# Patient Record
Sex: Female | Born: 1960 | Race: White | Hispanic: No | Marital: Married | Smoking: Former smoker
Health system: Southern US, Community
[De-identification: ages and names within clinical notes are randomized; demographics above are authoritative.]

## PROBLEM LIST (undated history)

## (undated) DIAGNOSIS — E669 Obesity, unspecified: Secondary | ICD-10-CM

## (undated) DIAGNOSIS — F411 Generalized anxiety disorder: Secondary | ICD-10-CM

## (undated) DIAGNOSIS — L0201 Cutaneous abscess of face: Secondary | ICD-10-CM

## (undated) DIAGNOSIS — F419 Anxiety disorder, unspecified: Secondary | ICD-10-CM

## (undated) DIAGNOSIS — R51 Headache: Secondary | ICD-10-CM

## (undated) DIAGNOSIS — F32A Depression, unspecified: Secondary | ICD-10-CM

## (undated) DIAGNOSIS — I10 Essential (primary) hypertension: Secondary | ICD-10-CM

## (undated) DIAGNOSIS — F329 Major depressive disorder, single episode, unspecified: Secondary | ICD-10-CM

## (undated) DIAGNOSIS — L03211 Cellulitis of face: Secondary | ICD-10-CM

## (undated) DIAGNOSIS — R946 Abnormal results of thyroid function studies: Secondary | ICD-10-CM

## (undated) HISTORY — DX: Generalized anxiety disorder: F41.1

## (undated) HISTORY — DX: Cutaneous abscess of face: L02.01

## (undated) HISTORY — DX: Cellulitis of face: L03.211

## (undated) HISTORY — DX: Abnormal results of thyroid function studies: R94.6

## (undated) HISTORY — DX: Anxiety disorder, unspecified: F41.9

## (undated) HISTORY — DX: Headache: R51

## (undated) HISTORY — DX: Essential (primary) hypertension: I10

## (undated) HISTORY — DX: Obesity, unspecified: E66.9

---

## 2000-07-05 ENCOUNTER — Encounter: Admission: RE | Admit: 2000-07-05 | Discharge: 2000-08-11 | Payer: Self-pay | Admitting: Obstetrics and Gynecology

## 2000-09-25 ENCOUNTER — Observation Stay (HOSPITAL_COMMUNITY): Admission: AD | Admit: 2000-09-25 | Discharge: 2000-09-26 | Payer: Self-pay | Admitting: Obstetrics & Gynecology

## 2000-09-25 ENCOUNTER — Encounter: Payer: Self-pay | Admitting: Obstetrics and Gynecology

## 2000-09-27 ENCOUNTER — Observation Stay (HOSPITAL_COMMUNITY): Admission: AD | Admit: 2000-09-27 | Discharge: 2000-09-28 | Payer: Self-pay | Admitting: Obstetrics and Gynecology

## 2000-10-01 ENCOUNTER — Inpatient Hospital Stay (HOSPITAL_COMMUNITY): Admission: AD | Admit: 2000-10-01 | Discharge: 2000-10-04 | Payer: Self-pay | Admitting: Obstetrics and Gynecology

## 2000-11-24 ENCOUNTER — Other Ambulatory Visit: Admission: RE | Admit: 2000-11-24 | Discharge: 2000-11-24 | Payer: Self-pay | Admitting: Obstetrics and Gynecology

## 2001-12-25 ENCOUNTER — Other Ambulatory Visit: Admission: RE | Admit: 2001-12-25 | Discharge: 2001-12-25 | Payer: Self-pay | Admitting: Obstetrics and Gynecology

## 2002-02-14 HISTORY — PX: LAPAROSCOPIC HYSTERECTOMY: SHX1926

## 2002-07-18 ENCOUNTER — Encounter: Admission: RE | Admit: 2002-07-18 | Discharge: 2002-10-16 | Payer: Self-pay | Admitting: Obstetrics and Gynecology

## 2002-12-26 ENCOUNTER — Observation Stay (HOSPITAL_COMMUNITY): Admission: RE | Admit: 2002-12-26 | Discharge: 2002-12-27 | Payer: Self-pay | Admitting: Obstetrics and Gynecology

## 2002-12-26 ENCOUNTER — Encounter (INDEPENDENT_AMBULATORY_CARE_PROVIDER_SITE_OTHER): Payer: Self-pay | Admitting: Specialist

## 2003-07-28 ENCOUNTER — Emergency Department (HOSPITAL_COMMUNITY): Admission: EM | Admit: 2003-07-28 | Discharge: 2003-07-28 | Payer: Self-pay | Admitting: Emergency Medicine

## 2004-02-15 HISTORY — PX: GALLBLADDER SURGERY: SHX652

## 2004-08-18 ENCOUNTER — Ambulatory Visit: Payer: Self-pay | Admitting: Pulmonary Disease

## 2004-09-17 ENCOUNTER — Encounter: Admission: RE | Admit: 2004-09-17 | Discharge: 2004-09-17 | Payer: Self-pay | Admitting: Family Medicine

## 2004-12-03 ENCOUNTER — Ambulatory Visit (HOSPITAL_COMMUNITY): Admission: RE | Admit: 2004-12-03 | Discharge: 2004-12-04 | Payer: Self-pay | Admitting: Surgery

## 2004-12-03 ENCOUNTER — Encounter (INDEPENDENT_AMBULATORY_CARE_PROVIDER_SITE_OTHER): Payer: Self-pay | Admitting: Specialist

## 2005-02-24 ENCOUNTER — Other Ambulatory Visit: Admission: RE | Admit: 2005-02-24 | Discharge: 2005-02-24 | Payer: Self-pay | Admitting: Obstetrics and Gynecology

## 2007-04-10 ENCOUNTER — Encounter: Admission: RE | Admit: 2007-04-10 | Discharge: 2007-04-10 | Payer: Self-pay | Admitting: Family Medicine

## 2007-11-29 ENCOUNTER — Encounter: Admission: RE | Admit: 2007-11-29 | Discharge: 2007-11-29 | Payer: Self-pay | Admitting: Obstetrics and Gynecology

## 2008-02-15 HISTORY — PX: ROTATOR CUFF REPAIR: SHX139

## 2008-12-12 ENCOUNTER — Ambulatory Visit (HOSPITAL_BASED_OUTPATIENT_CLINIC_OR_DEPARTMENT_OTHER): Admission: RE | Admit: 2008-12-12 | Discharge: 2008-12-12 | Payer: Self-pay | Admitting: Orthopedic Surgery

## 2009-01-01 ENCOUNTER — Emergency Department (HOSPITAL_COMMUNITY): Admission: EM | Admit: 2009-01-01 | Discharge: 2009-01-01 | Payer: Self-pay | Admitting: Emergency Medicine

## 2009-04-30 ENCOUNTER — Encounter: Admission: RE | Admit: 2009-04-30 | Discharge: 2009-04-30 | Payer: Self-pay | Admitting: Family Medicine

## 2010-03-07 ENCOUNTER — Encounter: Payer: Self-pay | Admitting: Family Medicine

## 2010-07-02 NOTE — Op Note (Signed)
[name], [name]                  ACCOUNT NO.:  [id number]   MEDICAL RECORD NO.:  [id number]          PATIENT TYPE:  AMB   LOCATION:  DAY                          FACILITY:  [facility]   PHYSICIAN:  Currie Paris, M.D.DATE OF BIRTH:  [birth year]   DATE OF PROCEDURE:  12/03/2004  DATE OF DISCHARGE:                                 OPERATIVE REPORT   CCS:  [id number].   PREOPERATIVE DIAGNOSIS:  Gallstones with biliary colic.   POSTOPERATIVE DIAGNOSIS:  Gallstones with biliary colic.   OPERATION:  Laparoscopic cholecystectomy with operative cholangiogram.   SURGEON:  Dr. Jamey Ripa.   ASSISTANT:  Dr. Orson Slick.   ANESTHESIA:  General endotracheal.   CLINICAL HISTORY:  This is a 50 year old lady with biliary type symptoms and  known gallstones.   DESCRIPTION OF PROCEDURE:  The patient was seen in the holding area and had  no further questions and confirmed that we were planning for  cholecystectomy.   She was taken to the operating room and after satisfactory general  anesthesia had been obtained, the abdomen was prepped and draped. The time-  out occurred.   I used 0.25% plain Marcaine for each incision. An umbilical incision was  made, the fascia opened and the peritoneum picked up and entered under  direct vision. A pursestring was placed, the Hasson introduced and the  abdomen insufflated to 15.   A camera was placed and no gross abnormalities were seen. The patient was  placed in reverse Trendelenburg and tilted to the left. A 10/11 trocar was  placed under direct vision in the epigastrium and likewise two 5 mm  laterally.   The gallbladder was retracted over the liver but was somewhat intrahepatic  and we could not really retract it completely over the liver. The peritoneum  over the triangle of Calot was opened and the peritoneum on either side of  the gallbladder opened up so the I could dissect out and nicely identified  the cystic artery and cystic duct and have a window  in the triangle of Calot  to be sure my anatomy was correct.   I put a single clip on the cystic duct at its junction with the gallbladder  and a single clip on the cystic artery. The cystic duct was opened and a  Cook catheter entered percutaneously and threaded into the cystic duct and  held with a clip.   Operative cholangiography showed good filling of the common duct and  duodenum and hepatic radicals with no filling defects.   The cystic duct catheter was removed and three clips placed on the stay side  of the cystic duct and it was divided. Two additional clips were placed on  the artery and it was divided leaving two clips on the stay side.   The gallbladder was removed from below to above. It was thin-walled and we  did spill some bile taking it out and it was relatively intrahepatic making  this a little bit tedious. Once it was disconnected, it was placed in a bag.  I irrigated and used the cautery to achieve  hemostasis. Once everything  appeared to be dry, the gallbladder was brought out the umbilical port. The  port was occluded for a few moments while we reinsufflated and made a final  check for hemostasis and a final irrigation. Again everything appeared to be  dry. The lateral ports were then removed under direct vision. The abdomen  was deflated through the epigastric port. The umbilical port was closed with  the pursestring prior to removing the camera. The skin was closed with 4-0  Monocryl subcuticular plus Dermabond.   The patient tolerated the procedure well. There were no operative  complications. All counts were correct.      Currie Paris, M.D.  Electronically Signed     CJS/MEDQ  D:  12/03/2004  T:  12/03/2004  Job:  [id number]   cc:   Talmadge Coventry, M.D.  Fax: [phone]

## 2010-07-02 NOTE — H&P (Signed)
NAME:  [name], [name]                            ACCOUNT NO.:  [id number]   MEDICAL RECORD NO.:  [id number]                   PATIENT TYPE:  OBV   LOCATION:  NA                                   FACILITY:  [facility]   PHYSICIAN:  Dineen Kid. Rana Snare, M.D.                 DATE OF BIRTH:  08-Feb-1961   DATE OF ADMISSION:  12/26/2002  DATE OF DISCHARGE:                                HISTORY & PHYSICAL   HISTORY OF PRESENT ILLNESS:  [name] is a 50 year old G2, P2 with  worsening problems with abnormal uterine bleeding, dysmenorrhea, and pelvic  pain which has not been controlled on birth control pills, cannot tolerate  birth control pills due to headaches the entire time she is on a pill which  is severe and disabling, when she does take the pill she gets some  controlled bleeding but again cannot tolerate the birth control pills.  Off  the pill she has irregular bleeding, terrible discomfort not responsive to  conservative management which has included anti-inflammatory medications,  she has had two D&Cs in the past and a sonohistogram as of last year which  was normal and has not helped with the irregular heavy bleeding.  She  desires definitive surgical treatment and requests hysterectomy.  She also  has a lot of discomfort with a predilection towards the left adnexa and  history of a left ovarian cyst and desires left oophorectomy at the time.   PAST MEDICAL HISTORY:  Her past medical history is significant for social  anxiety disorder.   PAST OBSTETRICAL HISTORY:  Her past OB history is significant for two  vaginal deliveries.   MEDICATIONS:  She is currently on Zoloft and ibuprofen.   ALLERGIES:  She is allergic to SULFA and VALIUM and IV DYE.   PHYSICAL EXAMINATION:  VITAL SIGNS:  Blood pressure is 124/78.  HEART:  Regular rate and rhythm.  LUNGS:  Clear to auscultation bilaterally.  ABDOMEN:  Nondistended, nontender.  PELVIC:  The uterus is anteverted, mobile, nontender to deep  palpation, the  left adnexa without adnexal masses.  Previous ultrasound shows a 7.3 x 4.4 x  5.7 cm uterus with no identifiable fibroids, the ovaries were within normal  limits.   IMPRESSION AND PLAN:  Menometrorrhagia, dysmenorrhea, pelvic pain not  responsive to conservative medical management including oral contraceptive  agents, anti-inflammatory medications, dilation and curettage's.  Patient  desires definitive surgical treatment.  Plan laparoscopic assisted vaginal  hysterectomy with left salpingo-oophorectomy.  Risks and benefits were  discussed at length which include but are  not limited to risk of infection,  bleeding, damage to bowel, bladder, ureters, other ovary, possibility this  may not alleviate the pelvic pain, worsened pelvic pain, risks associated  with blood transfusion and anesthesia.  She does give her informed consent.  She has been offered other treatment options such as Provera, repeat  dilation and curettage, or laparoscopic evaluation.  The  patient also reports that she has some mild urinary incontinence.  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  She has been evaluated by Dr. Etta Grandchild who at  this time does not feel surgical intervention is necessary so we will plan  to proceed as scheduled.                                               Dineen Kid Rana Snare, M.D.    DCL/MEDQ  D:  12/25/2002  T:  12/25/2002  Job:  [id number]

## 2010-07-02 NOTE — Op Note (Signed)
NAME:  [name], [name]                            ACCOUNT NO.:  [id number]   MEDICAL RECORD NO.:  [id number]                   PATIENT TYPE:  OBV   LOCATION:  9399                                 FACILITY:  [facility]   PHYSICIAN:  Dineen Kid. Rana Snare, M.D.                 DATE OF BIRTH:  1960/03/27   DATE OF PROCEDURE:  12/26/2002  DATE OF DISCHARGE:                                 OPERATIVE REPORT   PREOPERATIVE DIAGNOSES:  1. Menometrorrhagia.  2. Dysmenorrhea.  3. Pelvic pain not responsive to conservative medical management.   POSTOPERATIVE DIAGNOSES:  1. Menometrorrhagia.  2. Dysmenorrhea.  3. Pelvic pain not responsive to conservative medical management.  4. Pelvic adhesions.   PROCEDURE:  Laparoscopically assisted vaginal hysterectomy with left  salpingo-oophorectomy.   SURGEON:  Dineen Kid. Rana Snare, M.D.   ASSISTANT:  Raynald Kemp, M.D.   ANESTHESIA:  General endotracheal anesthesia.   INDICATIONS FOR PROCEDURE:  [name] is a 50 year old G2, P2 worsening  problems with uterine bleeding, dysmenorrhea and pelvic pain which had not  responded to conservative medical management which included birth control  pills, D&C or anti-inflammatory medications.  She presents for definitive  surgical treatments and plan left salpingo-oophorectomy and laparoscopically  assisted vaginal hysterectomy.  Risks and benefits were discussed at length.  Informed consent was obtained.  See history and physical for further  details.   FINDINGS:  Slightly enlarged uterus.  Left tube and ovary were completely  adhered to the pelvic sidewall, left descending colon was also adhered to  the left pelvic sidewall.   DESCRIPTION OF PROCEDURE:  After adequate analgesia, the patient placed in  the dorsal lithotomy position.  She is sterilely prepped and draped.  The  bladder was sterilely drained.  Speculum is placed.  Hulka tenaculum is  placed on the cervix.  A 1 cm infraumbilical skin incision was made.  A  Veress needle was inserted.  The abdomen was insufflated with dullness to  percussion.  An 11 mm trocar was inserted.  The above findings were noted.  A 5 mm trocar was inserted to the left of the midline under direct  visualization.  A Gyrus tripolar ligature was used to ligate across the  right fallopian tube down across the right utero-ovarian ligament and down  across the round ligament thoroughly cauterizing and ligating and dissecting  down below the round ligament with the right ovary and tube falling away  from the uterus and hemostasis achieved.  The left ovary and fallopian tube  were dissected from the pelvic sidewall with care taken to avoid the  underlying vessels.  The left descending colon was also dissected from the  pelvic sidewall.  The infundibulopelvic ligament was then identified and  ligated with the tripolar and dissected down across the round ligament.  This time, bleeding was noted underneath the uterus.  Reexamination of the  right  revealed the bleeding from the uterine vasculature from the right  side.  This was cauterized with the Gyrus.  Bladder was then elevated,  dissected off the anterior surface of the cervix.  The abdomen was  desufflated.  Legs were repositioned.  Weighted speculum was placed in the  vagina.  Jacob's tenaculum was placed in the posterior lip of the cervix.  Posterior colpotomy was performed.  At this time, some bleeding in the  posterior cul-de-sac was noted from the lower branch of the uterine artery.  LigaSure was used to ligate across the uterosacral ligaments and dissected.  Bladder pillars were then clamped with LigaSure clamp and dissected.  The  bladder was dissected off the anterior surface of the cervix.  Deaver  retractor placed underneath the bladder and successive bites across the  uterine vasculature with the LigaSure and good thermal burn noted.  Some  small perforated bleeders were noted along the uterosacral ligaments.   After  being identified, they were clamped and then ligated with the LigaSure with  hemostasis achieved.  The uterus was removed and the tube and ovary were  removed separately from the left side.  The uterosacral ligaments were  identified and ligated with figure-of-eight 0 Monocryl.  The posterior  peritoneum was then closed in a pursestring fashion.  The vaginal mucosa was  then plicated with figure-of-eights of 0 Monocryl in a vertical fashion with  good hemostasis achieved and good approximation achieved.  Foley catheter  was placed with return of clear, yellow urine. The abdomen was  reinsufflated.  Legs repositioned and small bleeders were noted and  cauterized with the bipolar cautery after copious irrigation and adequate  hemostasis.  Generalized oozing from the base of the bladder and peritoneal  edges previously cauterized were made hemostatic with Gelfoam placed in the  cul-de-sac.  Reexamination of the right ovary and tube revealed good  hemostasis and ureters appeared to be peristalsing without problem.  This  time, hemostasis appeared to be achieved.  The abdomen was desufflated.  The  trocars were removed.  The infraumbilical skin incision  was closed with 0  Vicryl figure-of-eight in the fascia and 3-0 Vicryl Rapide subcuticular  stitch.  The 5 mm site was closed with 3-0 Vicryl Rapide subcuticular  stitch.  The incisions were injected with 0.25% Marcaine 10 mL total.  The  patient tolerated the procedure well and was stable on transfer to the  recovery room.  Estimated blood loss was 800 mL.                                               Dineen Kid Rana Snare, M.D.    DCL/MEDQ  D:  12/26/2002  T:  12/26/2002  Job:  [id number]

## 2010-07-02 NOTE — Discharge Summary (Signed)
   NAME:  [name], [name]                            ACCOUNT NO.:  [id number]   MEDICAL RECORD NO.:  [id number]                   PATIENT TYPE:  OBV   LOCATION:  9103                                 FACILITY:  [facility]   PHYSICIAN:  Dineen Kid. Rana Snare, M.D.                 DATE OF BIRTH:  [birth year]   DATE OF ADMISSION:  12/26/2002  DATE OF DISCHARGE:                                 DISCHARGE SUMMARY   HISTORY OF PRESENT ILLNESS:  [name] is a 50 year old G2 P2, worsening  menometrorrhagia, dysmenorrhea, and pelvic pain which has not been under  control with conservative management including birth control pills,  antiinflammatory medications, or D&C.  She presents for definitive surgical  treatment with planned hysterectomy as well as left salpingo-oophorectomy  since the predominance of her pain is on the left side.   HOSPITAL COURSE:  The patient was admitted the same day of surgery.  She  underwent laparoscopy-assisted vaginal hysterectomy and left salpingo-  oophorectomy.  The surgery was complicated by slightly greater than average  intraoperative blood loss; however, no complications.  Her postoperative  course was unremarkable with good return of bowel function.  She ambulated  without difficulty, tolerated a regular diet.  Postoperative hemoglobin was  10.9.  She remained afebrile throughout her hospital stay and at the time of  discharge was tolerating a regular diet and pain was well controlled with  oral medications.  Physical exam at time of discharge:  The incisions were  clean, dry, and intact.  She had normal active bowel sounds and her abdomen  was nondistended and nontender.   DISPOSITION:  The patient discharged home with follow-up in the office in  one to two weeks.  Sent home with a routine order sheet for hysterectomy  including light activity; return for fever, pain, or bleeding.  She was sent  home with a prescription for Tylox #30.               Dineen Kid Rana Snare, M.D.    DCL/MEDQ  D:  12/27/2002  T:  12/27/2002  Job:  [id number]

## 2010-07-02 NOTE — Consult Note (Signed)
NAMEALICESON, DOLBOW                            ACCOUNT NO.:  [id number]   MEDICAL RECORD NO.:  [id number]                   PATIENT TYPE:  EMS   LOCATION:  ED                                   FACILITY:  [facility]   PHYSICIAN:  Katy Fitch. Naaman Plummer., M.D.          DATE OF BIRTH:  [birth year]   DATE OF CONSULTATION:  07/28/2003  DATE OF DISCHARGE:                                   CONSULTATION   CHIEF COMPLAINT:  Crushing injury of right ring finger.   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  Earlier  this evening, while visiting [employer], crushed her right ring  finger in a closing door mechanism.  She was transported to the [facility] emergency room, where she was evaluated by Dr. Carleene Cooper.  She was noted to be awake, alert and stable.  Her injuries were  confined to the right hand.  Dr. Ignacia Palma obtained x-rays revealing an open  fracture and requested a hand surgery consult.   CLINICAL EXAMINATION:  GENERAL:  An awake and alert 50 year old woman.  VITAL SIGNS:  Temperature 99.5, blood pressure 171/100, pulse 103,  respirations 20.   ALLERGIES:  IVP DYE, SULFA, VALIUM.   MEDICATIONS:  She was given a tetanus booster in the emergency room prior to  my consultation.  She was a current medication of Zoloft.   FAMILY HISTORY, SOCIAL HISTORY, REVIEW OF SYSTEMS:  These were reviewed and  are otherwise noncontributory.   DIAGNOSTIC DATA:  Her x-rays were reviewed and revealed a comminuted  fracture.  Her finger was inspected and revealed a stellate-type laceration  across her ventral nail fold, with a can lid-type near amputation of her  fingertip with marked crushing of the distal pulp.   ASSESSMENT:  1. Crushing open fracture of right ring finger distal phalanx.  2. Stellate explosion-type laceration of nail matrix.   PLAN:  We recommended  IV Ancef as a prophylactic antibiotic, followed by  digital block irrigation and debridement, and reconstruction of the  fingertip in the emergency room.  Questions regarding the procedure were  invited and answered.                                               Katy Fitch Naaman Plummer., M.D.    RVS/MEDQ  D:  07/28/2003  T:  07/29/2003  Job:  [id number]

## 2010-07-02 NOTE — Op Note (Signed)
[name]                            ACCOUNT NO.:  [id number]   MEDICAL RECORD NO.:  [id number]                   PATIENT TYPE:  EMS   LOCATION:  ED                                   FACILITY:  [facility]   PHYSICIAN:  [name]., M.D.          DATE OF BIRTH:  [birth year]   DATE OF PROCEDURE:  07/28/2003  DATE OF DISCHARGE:                                 OPERATIVE REPORT   PREOPERATIVE DIAGNOSES:  Complex crushing injury, right ring finger through  middle third of visible nail matrix; with open fracture of distal metaphysis  of distal phalanx and complex crush of pulp.   POSTOPERATIVE DIAGNOSES:  Complex crushing injury, right ring finger through  middle third of visible nail matrix; with open fracture of distal metaphysis  of distal phalanx and complex crush of pulp.   OPERATIONS:  1. Irrigation and debridement of complex open fracture of right ring finger     distal phalanx.  2. Open reduction internal fixation of distal phalanx fracture, utilizing 5-     0 nylon cutaneous sutures.  3. Reconstruction of complex nail fold laceration.   SURGEON:  [name], M.D.   ASSISTANT:  None.   ANESTHESIA:  Lidocaine 2% metacarpal head level block of right small finger,  with IV administration of Ancef as a perioperative antibiotic.   INDICATIONS:  [name] is a 50 year old right-hand dominant woman who  was entering the pool earlier this evening at [employer] with  her son, at which time she sustained a complex injury to her right ring  finger.   She accidentally caught her finger at a closing door mechanism, sustaining a  crush between the heavy door and the door frame.  She was transported by  private car to [facility] Emergency Room, where she was noted to have  significant bleeding and an obvious crush deformity of her right ring  finger.  X-rays were obtained by the attending emergency room physician, Dr.  Carleene Cooper, and interpreted  to reveal an open fracture of the distal  phalanx.  An upper extremity orthopedic consult was requested.   PHYSICAL EXAMINATION:  GENERAL:  Clinical examination revealed an awake and  alert 50 year old woman, who was otherwise unharmed except for injuries  confined to her right ring finger.  VITAL SIGNS:  Temperature 99.5, blood pressure 171/100 (automatic sitting  left arm), pulse 103, respirations 20.  EXTREMITIES:  Her clinical examination was unremarkable except for the right  hand, which was covered with quite a bit of blood from her open fracture.   DESCRIPTION OF PROCEDURE:  After informed consent, a 2% lidocaine metacarpal  head level block was placed with a sheath block of the flexor tendon sheath,  followed by a dorsal block across the metacarpal neck.  After five minutes  she had complete anesthesia.   Her arm was then prepped with a Hibiclens sponge  and irrigated with two  liters of sterile saline, thoroughly cleaning the right hand.  The hand was  then prepped with Betadine and sterilely draped with towels.  The finger was  exsanguinated with gauze wrap and a 1/4-inch Penrose drain was used over the  P1 segment as a digital tourniquet.   The fracture was opened shotgun-style and cleaned of all hematoma and  foreign material.  The fracture was then irrigated with Betadine and saline.  The fracture was then anatomically reduced and the near amputation of the  pulp corrected with six 5-0 nylon simple sutures.  The nail matrix was then  reassembled jigsaw puzzle style, with several fragments along the junction  with the proximal portion of the matrix being free fragments.  Multiple  sutures of 7-0 chromic were utilized to repair the nail matrix.   The nail plate was then cleaned of all soft tissue and replaced deep to the  dorsal nail fold.  The finger was dressed with Xero flow sterile gauze and a  one inch Coban finger dressing secured at the wrist level.   DISPOSITION:   There were no apparent complications.  [name] was given a  prescription for Keflex 500 mg one p.o. q.8h. x4 days for prophylactic  antibiotic, and received 1 g of Ancef perioperatively in the emergency room.  She will be given Percocet 5/325 mg 1-2 tablets p.o. q.4-6h. p.r.n. pain (a  total of 24 tablets without refill).  She is also given Motrin 600 mg one  p.o. q.6h. p.r.n. pain.   FOLLOW UP SPECIAL INSTRUCTIONS:  She will return to the office for follow-up  in approximately one week.  She is instructed to elevate her hand for the  next four days, and to work on range of motion exercises to the adjacent  uninjured fingers, wrist, elbow and shoulder.                                               Ann Fitch Naaman Montgomery., M.D.    RVS/MEDQ  D:  07/28/2003  T:  07/29/2003  Job:  [id number]

## 2011-01-18 ENCOUNTER — Other Ambulatory Visit (HOSPITAL_COMMUNITY): Payer: Self-pay | Admitting: Gastroenterology

## 2011-01-21 ENCOUNTER — Ambulatory Visit (HOSPITAL_COMMUNITY)
Admission: RE | Admit: 2011-01-21 | Discharge: 2011-01-21 | Disposition: A | Discharge status: Home / Self Care | Payer: Self-pay | Source: Ambulatory Visit | Attending: Gastroenterology | Admitting: Gastroenterology

## 2011-01-21 DIAGNOSIS — R1310 Dysphagia, unspecified: Secondary | ICD-10-CM | POA: Insufficient documentation

## 2011-12-29 ENCOUNTER — Other Ambulatory Visit (HOSPITAL_COMMUNITY): Payer: Self-pay | Admitting: Cardiovascular Disease

## 2011-12-29 ENCOUNTER — Other Ambulatory Visit (HOSPITAL_COMMUNITY): Payer: Self-pay | Admitting: *Deleted

## 2011-12-29 ENCOUNTER — Other Ambulatory Visit (HOSPITAL_COMMUNITY): Payer: Self-pay

## 2011-12-29 DIAGNOSIS — I10 Essential (primary) hypertension: Secondary | ICD-10-CM

## 2011-12-29 DIAGNOSIS — R0602 Shortness of breath: Secondary | ICD-10-CM

## 2012-01-05 ENCOUNTER — Encounter (HOSPITAL_COMMUNITY): Payer: Self-pay

## 2012-01-05 ENCOUNTER — Ambulatory Visit (HOSPITAL_COMMUNITY)
Admission: RE | Admit: 2012-01-05 | Discharge: 2012-01-05 | Disposition: A | Discharge status: Home / Self Care | Payer: Managed Care, Other (non HMO) | Source: Ambulatory Visit | Attending: Cardiovascular Disease | Admitting: Cardiovascular Disease

## 2012-01-05 DIAGNOSIS — I10 Essential (primary) hypertension: Secondary | ICD-10-CM | POA: Insufficient documentation

## 2012-01-05 DIAGNOSIS — E785 Hyperlipidemia, unspecified: Secondary | ICD-10-CM | POA: Insufficient documentation

## 2012-01-05 DIAGNOSIS — R0602 Shortness of breath: Secondary | ICD-10-CM | POA: Insufficient documentation

## 2012-01-05 NOTE — Progress Notes (Signed)
Renal Duplex Sonogram Completed. Sibyl Parr 01/05/2012

## 2012-01-05 NOTE — Progress Notes (Signed)
[facility]   2D echo completed 01/05/2012.   Cindy Tonnie Stillman, RDCS   

## 2012-01-09 ENCOUNTER — Ambulatory Visit (HOSPITAL_COMMUNITY)
Admission: RE | Admit: 2012-01-09 | Discharge: 2012-01-09 | Disposition: A | Discharge status: Home / Self Care | Payer: Managed Care, Other (non HMO) | Source: Ambulatory Visit | Attending: Cardiovascular Disease | Admitting: Cardiovascular Disease

## 2012-01-09 DIAGNOSIS — E785 Hyperlipidemia, unspecified: Secondary | ICD-10-CM | POA: Insufficient documentation

## 2012-01-09 DIAGNOSIS — R079 Chest pain, unspecified: Secondary | ICD-10-CM | POA: Insufficient documentation

## 2012-01-09 DIAGNOSIS — Z87891 Personal history of nicotine dependence: Secondary | ICD-10-CM | POA: Insufficient documentation

## 2012-01-09 DIAGNOSIS — K219 Gastro-esophageal reflux disease without esophagitis: Secondary | ICD-10-CM | POA: Insufficient documentation

## 2012-01-09 DIAGNOSIS — R0602 Shortness of breath: Secondary | ICD-10-CM | POA: Insufficient documentation

## 2012-01-09 MED ORDER — TECHNETIUM TC 99M SESTAMIBI GENERIC - CARDIOLITE
8.1000 | Freq: Once | INTRAVENOUS | Status: AC | PRN
  Administered 2012-01-09: 8 via INTRAVENOUS

## 2012-01-09 MED ORDER — TECHNETIUM TC 99M SESTAMIBI GENERIC - CARDIOLITE
26.4000 | Freq: Once | INTRAVENOUS | Status: AC | PRN
  Administered 2012-01-09: 26 via INTRAVENOUS

## 2012-01-09 MED ORDER — REGADENOSON 0.4 MG/5ML IV SOLN
0.4000 mg | Freq: Once | INTRAVENOUS | Status: AC
  Administered 2012-01-09: 0.4 mg via INTRAVENOUS

## 2012-01-09 NOTE — Procedures (Addendum)
[facility] [address] [place] [address] [place] [address] [phone]  Cardiology Nuclear Med Study  [name] is a 51 y.o. female     MRN : [id number]     DOB: [birth year]  Procedure Date: 01/09/2012  Nuclear Med Background Indication for Stress Test:  Evaluation for Ischemia History:  Gerd Cardiac Risk Factors: History of Smoking, Hypertension, Lipids and Overweight  Symptoms:  Chest Pain   Nuclear Pre-Procedure Caffeine/Decaff Intake:  5:00am NPO After: 12:00am  IV Site: R Antecubital  IV 0.9% NS with Angio Cath:  22g  Chest Size (in):  n/a IV Started by: Koren Shiver, CNMT  Height: 5\' 11"  (1.803 m)  Cup Size: D  BMI:  Body mass index is 30.40 kg/(m^2). Weight:  218 lb (98.884 kg)   Tech Comments:  n/a    Nuclear Med Study 1 or 2 day study: 1 day  Stress Test Type:  Lexiscan  Order Authorizing Provider:  R.Weintraub, MD   Resting Radionuclide: Technetium 15m Sestamibi  Resting Radionuclide Dose: 8.1 mCi   Stress Radionuclide:  Technetium 74m Sestamibi  Stress Radionuclide Dose: 26.4 mCi           Stress Protocol Rest HR:  71 Stress HR: 108  Rest BP: 158/108 Stress BP: 173/103  Exercise Time (min): n/a METS: n/a   Predicted Max HR: 171 bpm % Max HR: 63.16 bpm Rate Pressure Product: 21308   Dose of Adenosine (mg):  n/a Dose of Lexiscan: 0.4 mg  Dose of Atropine (mg): n/a Dose of Dobutamine: n/a mcg/kg/min (at max HR)  Stress Test Technologist: Esperanza Sheets, CCT Nuclear Technologist: Gonzella Lex, CNMT   Rest Procedure:  Myocardial perfusion imaging was performed at rest 45 minutes following the intravenous administration of Technetium 50m Sestamibi. Stress Procedure:  The patient received IV Lexiscan 0.4 mg over 15-seconds.  Technetium 71m Sestamibi injected at 30-seconds.  There were no significant changes with Lexiscan.  Quantitative spect images were obtained after a 45 minute delay.  Transient Ischemic  Dilatation (Normal <1.22):  0.91 Lung/Heart Ratio (Normal <0.45):  0.21 QGS EDV:  83 ml QGS ESV:  39 ml LV Ejection Fraction: 52%  Signed by Esperanza Sheets C on 01/09/2012 at 9:22 AM.    Rest ECG: NSR - Normal EKG  Stress ECG: No significant change from baseline ECG  QPS Raw Data Images:  Normal; no motion artifact; normal heart/lung ratio. Stress Images:  Normal homogeneous uptake in all areas of the myocardium. Rest Images:  Normal homogeneous uptake in all areas of the myocardium. Subtraction (SDS):  No evidence of ischemia. LV Wall Motion:  Borderline overall systolic function, estimated EF is 52%. Normal regional wall motion.  Impression Exercise Capacity:  Lexiscan with no exercise. BP Response:  Normal blood pressure response. Clinical Symptoms:  There is dyspnea. ECG Impression:  No significant ST segment change suggestive of ischemia. Comparison with Prior Nuclear Study: No images to compare  Overall Impression:  Normal stress nuclear study. There is no sign of scar or ischemia. There is normal motion and a low normal ejection fraction.     Thurmon Fair, MD  01/09/2012 12:32 PM

## 2012-01-30 ENCOUNTER — Other Ambulatory Visit: Payer: Self-pay | Admitting: Family Medicine

## 2012-01-30 DIAGNOSIS — N6452 Nipple discharge: Secondary | ICD-10-CM

## 2012-02-03 ENCOUNTER — Ambulatory Visit
Admission: RE | Admit: 2012-02-03 | Discharge: 2012-02-03 | Disposition: A | Discharge status: Home / Self Care | Payer: Managed Care, Other (non HMO) | Source: Ambulatory Visit | Attending: Family Medicine | Admitting: Family Medicine

## 2012-02-03 ENCOUNTER — Other Ambulatory Visit: Payer: Self-pay | Admitting: Family Medicine

## 2012-02-03 DIAGNOSIS — N6452 Nipple discharge: Secondary | ICD-10-CM

## 2012-06-07 ENCOUNTER — Encounter: Payer: Self-pay | Admitting: Cardiovascular Disease

## 2013-01-24 ENCOUNTER — Other Ambulatory Visit: Payer: Self-pay

## 2013-01-24 DIAGNOSIS — Z1231 Encounter for screening mammogram for malignant neoplasm of breast: Secondary | ICD-10-CM

## 2013-03-01 ENCOUNTER — Ambulatory Visit
Admission: RE | Admit: 2013-03-01 | Discharge: 2013-03-01 | Disposition: A | Discharge status: Home / Self Care | Payer: Managed Care, Other (non HMO) | Source: Ambulatory Visit

## 2013-03-01 DIAGNOSIS — Z1231 Encounter for screening mammogram for malignant neoplasm of breast: Secondary | ICD-10-CM

## 2013-05-09 ENCOUNTER — Other Ambulatory Visit: Payer: Self-pay | Admitting: Family Medicine

## 2013-05-09 DIAGNOSIS — R51 Headache: Secondary | ICD-10-CM

## 2013-05-14 ENCOUNTER — Ambulatory Visit
Admission: RE | Admit: 2013-05-14 | Discharge: 2013-05-14 | Disposition: A | Discharge status: Home / Self Care | Payer: Managed Care, Other (non HMO) | Source: Ambulatory Visit | Attending: Family Medicine | Admitting: Family Medicine

## 2013-05-14 DIAGNOSIS — R51 Headache: Secondary | ICD-10-CM

## 2013-05-28 ENCOUNTER — Encounter: Payer: Self-pay | Admitting: Neurology

## 2013-05-29 ENCOUNTER — Encounter: Payer: Self-pay | Admitting: Neurology

## 2013-05-29 ENCOUNTER — Encounter (INDEPENDENT_AMBULATORY_CARE_PROVIDER_SITE_OTHER): Payer: Self-pay

## 2013-05-29 ENCOUNTER — Ambulatory Visit (INDEPENDENT_AMBULATORY_CARE_PROVIDER_SITE_OTHER): Payer: Managed Care, Other (non HMO) | Admitting: Neurology

## 2013-05-29 VITALS — BP 161/101 | HR 59 | Temp 97.5°F | Ht 71.0 in | Wt 218.0 lb

## 2013-05-29 DIAGNOSIS — G44009 Cluster headache syndrome, unspecified, not intractable: Secondary | ICD-10-CM

## 2013-05-29 DIAGNOSIS — G43909 Migraine, unspecified, not intractable, without status migrainosus: Secondary | ICD-10-CM

## 2013-05-29 MED ORDER — VERAPAMIL HCL 40 MG PO TABS: 40.0000 mg | ORAL_TABLET | Freq: Every day | ORAL | Status: DC

## 2013-05-29 MED ORDER — RIZATRIPTAN BENZOATE 5 MG PO TBDP: 5.0000 mg | ORAL_TABLET | ORAL | Status: DC | PRN

## 2013-05-29 NOTE — Progress Notes (Signed)
Subjective:    Patient ID: [name] is a 53 y.o. female.  HPI    Huston Foley, MD, PhD [facility] [address] [address] [place], [place] [address]  Dear Dr. Hyacinth Meeker,  I saw your patient, [name], upon your kind request in my neurologic clinic today for initial consultation of her recurrent, daily headaches, as well as concern for underlying obstructive sleep apnea. The patient is unaccompanied today. As you know, [name] is a 53 year old right-handed woman with an underlying medical history of anxiety, depression, hypertension, migraines, subclinical thyroid disease, fatty liver on CT, and hyperlipidemia, who has had frequent headaches for the past 3 or 4 months. They are almost daily at this time and usually in the left frontal or bifrontal area and left posterior neck. Her headache is described as a dull ache but when she will often wake up with headaches. She reports snoring. She has been taking over-the-counter headache medication daily. She has had some associated nausea but no vomiting, some diarrhea. She tried Imitrex years ago for headaches which made her headaches worse. She is overweight. She is on medication for her anxiety and depression, as well as for blood pressure. She had an MRI brain without contrast on 05/14/2013: Minimal mucosal thickening right frontal sinus and left ethmoid sinus air cell. Otherwise essentially negative unenhanced MR of the brain. In addition, I personally reviewed the images through the PACS system. 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  She does not snore, but used to when she was about 10-15 lb heavier. She has not been told that she has pauses in her breathing while asleep and does not wake up gasping. She has noted that she may have several days of severe HAs and it may be associated with L eye tearing and she has noted eye  injection on the R. She has noted in January a bump over her L eyebrow which is somewhat tender to touch. She has no Hx of childhood migraines, no migraines in the family and no Hx of concussion, whiplash, head injury. She sleeps well, and denies EDS, her ESS is 3/24 and she does not nap.  She has been taking ibuprofen maybe 3 days a week for the past 3 months, but up to 8 pills a day.   Never had TIA or stroke symptoms, denying sudden onset of one sided weakness, numbness, tingling, slurring of speech or droopy face, hearing loss, tinnitus, diplopia or visual field cut or monocular loss of vision, but feels, that her L eyelid may become heavy.   Her Past Medical History Is Significant For: Past Medical History  Diagnosis Date  . Headache(784.0)   . Cellulitis and abscess of face   . Generalized anxiety disorder   . Unspecified essential hypertension   . Obesity, unspecified   . Nonspecific abnormal results of thyroid function study   . Anxiety     Her Past Surgical History Is Significant For: Past Surgical History  Procedure Laterality Date  . Rotator cuff repair  2010  . Gallbladder surgery  2006  . Laparoscopic hysterectomy  2004    Her Family History Is Significant For: Family History  Problem Relation Age of Onset  . Hypertension Father   . Cancer Paternal Grandmother   . Thyroid disease Brother   . Thyroid disease Brother   . Diabetes Maternal Grandfather   . Glaucoma Paternal Grandfather   .  Irritable bowel syndrome Father   . Tremor Father     Her Social History Is Significant For: History   Social History  . Marital Status: Married    Spouse Name: N/A    Number of Children: N/A  . Years of Education: N/A   Social History Main Topics  . Smoking status: Former Smoker    Quit date: 02/28/1992  . Smokeless tobacco: None  . Alcohol Use: No  . Drug Use: No  . Sexual Activity: None   Other Topics Concern  . None   Social History Narrative  . None    Her  Allergies Are:  Allergies  Allergen Reactions  . Sulfa Antibiotics Anaphylaxis  :   Her Current Medications Are:  Outpatient Encounter Prescriptions as of 05/29/2013  Medication Sig  . valsartan (DIOVAN) 80 MG tablet Take 80 mg by mouth daily.  Marland Kitchen. venlafaxine XR (EFFEXOR-XR) 75 MG 24 hr capsule Take 75 mg by mouth daily with breakfast.  . [DISCONTINUED] cephALEXin (KEFLEX) 500 MG capsule Take 500 mg by mouth 3 (three) times daily.  :  Review of Systems:  Out of a complete 14 point review of systems, all are reviewed and negative with the exception of these symptoms as listed below:  Review of Systems  Constitutional: Positive for fatigue and unexpected weight change (gain).  Eyes: Positive for pain and visual disturbance (blurred vision).  Respiratory: Negative.   Cardiovascular: Negative.   Gastrointestinal: Positive for diarrhea.  Endocrine: Positive for heat intolerance.       Flushing  Genitourinary: Positive for enuresis.  Musculoskeletal: Positive for arthralgias.  Skin: Negative.   Allergic/Immunologic: Negative.   Neurological: Positive for dizziness and headaches (chronic).  Hematological: Negative.   Psychiatric/Behavioral: The patient is nervous/anxious.     Objective:  Neurologic Exam  Physical Exam Physical Examination:   Filed Vitals:   05/29/13 0906  BP: 161/101  Pulse: 59  Temp: 97.5 F (36.4 C)   General Examination: The patient is a very pleasant 53 y.o. female in no acute distress. She appears well-developed and well-nourished and well groomed.   HEENT: Normocephalic, atraumatic, pupils are equal, round and reactive to light and accommodation. Funduscopic exam is normal with sharp disc margins noted. Extraocular tracking is good without limitation to gaze excursion or nystagmus noted. Normal smooth pursuit is noted. Hearing is grossly intact. Tympanic membranes are clear bilaterally. Face is symmetric with normal facial animation and normal facial  sensation. Speech is clear with no dysarthria noted. There is no hypophonia. There is no lip, neck/head, jaw or voice tremor. Neck is supple with full range of passive and active motion. There are no carotid bruits on auscultation. Oropharynx exam reveals: mild mouth dryness, adequate dental hygiene and no significant airway crowding. Mallampati is class I. Tongue protrudes centrally and palate elevates symmetrically. Tonsils are small. She has a slight swelling over the L eyebrow, tender to touch, no redness, no palpable mass, no temporal tender, no eye injection no ptosis, no excess tearing.   Chest: Clear to auscultation without wheezing, rhonchi or crackles noted.  Heart: S1+S2+0, regular and normal without murmurs, rubs or gallops noted.   Abdomen: Soft, non-tender and non-distended with normal bowel sounds appreciated on auscultation.  Extremities: There is no pitting edema in the distal lower extremities bilaterally. Pedal pulses are intact.  Skin: Warm and dry without trophic changes noted. There are no varicose veins.  Musculoskeletal: exam reveals no obvious joint deformities, tenderness or joint swelling or erythema.  Neurologically:  Mental status: The patient is awake, alert and oriented in all 4 spheres. Her immediate and remote memory, attention, language skills and fund of knowledge are appropriate. There is no evidence of aphasia, agnosia, apraxia or anomia. Speech is clear with normal prosody and enunciation. Thought process is linear. Mood is normal and affect is normal.  Cranial nerves II - XII are as described above under HEENT exam. In addition: shoulder shrug is normal with equal shoulder height noted. Motor exam: Normal bulk, strength and tone is noted. There is no drift, tremor or rebound. Romberg is negative. Reflexes are 2+ throughout. Babinski: Toes are flexor bilaterally. Fine motor skills and coordination: intact with normal finger taps, normal hand movements, normal  rapid alternating patting, normal foot taps and normal foot agility.  Cerebellar testing: No dysmetria or intention tremor on finger to nose testing. Heel to shin is unremarkable bilaterally. There is no truncal or gait ataxia.  Sensory exam: intact to light touch, pinprick, vibration, temperature sense and proprioception in the upper and lower extremities.  Gait, station and balance: She stands easily. No veering to one side is noted. No leaning to one side is noted. Posture is age-appropriate and stance is narrow based. Gait shows normal stride length and normal pace. No problems turning are noted. She turns en bloc. Tandem walk is unremarkable. Intact toe and heel stance is noted.               Assessment and Plan:   In summary, [name] is a very pleasant 53 y.o.-year old female with an underlying medical history of HTN and depression and anxiety, whose history and exam concerning for migraine variant, but also concerning for a cluster HA variant. Her physical exam is non-focal and I reassured the patient in that regard. In addition, she had a benign MRI brain, which I reviewed. Her sleep related history really does not suggest any sleep disordered breathing. She used to snore but no longer does and denies any apneas while asleep or gasping sensations while asleep or any significant sleep disruption or daytime somnolence. I had a long chat with the patient about my findings and the diagnosis of cluster HA and migraine, or migraine-cluster HA variants, the prognosis and treatment options. We talked about medical treatments and non-pharmacological approaches. We talked about maintaining a healthy lifestyle in general. I encouraged the patient to eat healthy, exercise daily and keep well hydrated, to keep a scheduled bedtime and wake time routine, to not skip any meals and eat healthy snacks in between meals and to have protein with every meal.   I advised the patient about common headache triggers:  sleep deprivation, dehydration, overheating, stress, hypoglycemia or skipping meals and blood sugar fluctuations, excessive pain medications or excessive alcohol use or caffeine withdrawal. Some people have food triggers such as aged cheese, orange juice or chocolate, especially dark chocolate, or MSG (monosodium glutamate). She is to try to avoid these headache triggers as much possible. It may be helpful to keep a headache diary to figure out what makes Her headaches worse or brings them on and what alleviates them. Some people report headache onset after exercise but studies have shown that regular exercise may actually prevent headaches from coming. If She has exercise-induced headaches, She is advised to drink plenty of fluid before and after exercising and that to not overdo it and to not overheat.  As far as further diagnostic testing is concerned, I suggested no new test today.  As far as medications are concerned, I recommended the following at this time: verapamil for prevention, starting low at 40 mg qHS and for abortive treatment, we will try Maxalt. I talked to her about side effects of verapamil and Maxalt and also talked to her and explained the very rare possibility of serotonin syndrome since she is also taking Effexor. Of note, when she tried Imitrex in the past she was also on an antidepressant. I answered all her questions today and the patient was in agreement with the above outlined plan. I would like to see the patient back in 3 months, sooner if the need arises and encouraged her to call with any interim questions, concerns, problems or updates and refill requests.   Thank you very much for allowing me to participate in the care of this nice patient. If I can be of any further assistance to you please do not hesitate to call me at [phone].  Sincerely,   Huston Foley, MD, PhD

## 2013-05-29 NOTE — Patient Instructions (Signed)
Please remember, common headache triggers are: sleep deprivation, dehydration, overheating, stress, hypoglycemia or skipping meals and blood sugar fluctuations, excessive pain medications or excessive alcohol use or caffeine withdrawal. Some people have food triggers such as aged cheese, orange juice or chocolate, especially dark chocolate, or MSG (monosodium glutamate). Try to avoid these headache triggers as much possible. It may be helpful to keep a headache diary to figure out what makes your headaches worse or brings them on and what alleviates them. Some people report headache onset after exercise but studies have shown that regular exercise may actually prevent headaches from coming. If you have exercise-induced headaches, please make sure that you drink plenty of fluid before and after exercising and that you do not over do it and do not overheat.  Please limit your ibuprofen use.   We will try verapamil low dose for daily headache prevention. Verapamil 40 mg: take 1 pill at bedtime. Side effects may include: constipation, lightheadedness, fainting, heartburn, wheezing, slow heartbeat, blurry vision, blue lips and fingernails, pins and needles sensations. If you have a rash or difficulty breathing or chest pain, you have to stop the medication immediately. Call 911 if you think you are having a serious reaction.   Maxalt orally disintegrating tab, 5 mg: take 1 pill early on when you suspect a migraine attack come on. You may take another pill within 2 hours, no more than 2 pills in 24 hours, no more than 3 times a week. Most people who take triptans do not have any serious side-effects. However, they can cause drowsiness (remember to not drive or use heavy machinery when drowsy), nausea, dizziness, dry mouth. Less common side effects include strange sensations, such as tightness in your chest or throat, tingling, flushing, and feelings of heaviness or pressure in areas such as the face, limbs, and  chest. These in the chest can mimic heart related pain (angina) and may cause alarm, but usually these sensations are not harmful or a sign of a heart attack. However, if you develop intense chest pain or sensations of discomfort, you should stop taking your medication and consult with me or your PCP or go to the nearest urgent care facility or ER or call 911.   Please be aware that Maxalt or any other triptan and Effexor SSRI or SNRI together can sometimes cause a rare and potentially dangerous adverse reaction, called SEROTONIN SYNDROME: Symptoms of this condition include (but are not limited to):   Agitation or restlessness, confusion, rapid heart rate and high blood pressure, dilated pupils, loss of muscle coordination or twitching muscles, muscle rigidity/stiffness, sweating and/or flushing, diarrhea, headache, shivering, goose bumps. If you have any of these symptoms you may have to stop the medication. Call your health care provider immediately.  Severe serotonin syndrome can be life-threatening emergency. Signs and symptoms of a severe reaction may include: high fever, seizures, irregular heartbeat, unconsciousness or altered level of awareness or personality changes.  If you have any of these new symptoms, call 911 or have someone take you to the emergency room.

## 2013-10-29 ENCOUNTER — Ambulatory Visit: Payer: Managed Care, Other (non HMO) | Admitting: Neurology

## 2014-01-17 ENCOUNTER — Other Ambulatory Visit: Payer: Self-pay | Admitting: Family Medicine

## 2014-01-17 DIAGNOSIS — R221 Localized swelling, mass and lump, neck: Secondary | ICD-10-CM

## 2014-01-20 ENCOUNTER — Encounter (INDEPENDENT_AMBULATORY_CARE_PROVIDER_SITE_OTHER): Payer: Self-pay

## 2014-01-20 ENCOUNTER — Ambulatory Visit
Admission: RE | Admit: 2014-01-20 | Discharge: 2014-01-20 | Disposition: A | Discharge status: Home / Self Care | Payer: Managed Care, Other (non HMO) | Source: Ambulatory Visit | Attending: Family Medicine | Admitting: Family Medicine

## 2014-01-20 DIAGNOSIS — R221 Localized swelling, mass and lump, neck: Secondary | ICD-10-CM

## 2014-01-20 MED ORDER — IOHEXOL 300 MG/ML  SOLN
75.0000 mL | Freq: Once | INTRAMUSCULAR | Status: AC | PRN
  Administered 2014-01-20: 75 mL via INTRAVENOUS

## 2014-04-28 ENCOUNTER — Other Ambulatory Visit: Payer: Self-pay

## 2014-04-28 DIAGNOSIS — Z1231 Encounter for screening mammogram for malignant neoplasm of breast: Secondary | ICD-10-CM

## 2014-05-09 ENCOUNTER — Ambulatory Visit
Admission: RE | Admit: 2014-05-09 | Discharge: 2014-05-09 | Disposition: A | Discharge status: Home / Self Care | Payer: Managed Care, Other (non HMO) | Source: Ambulatory Visit

## 2014-05-09 DIAGNOSIS — Z1231 Encounter for screening mammogram for malignant neoplasm of breast: Secondary | ICD-10-CM

## 2014-10-14 ENCOUNTER — Encounter (HOSPITAL_BASED_OUTPATIENT_CLINIC_OR_DEPARTMENT_OTHER): Payer: Self-pay | Admitting: *Deleted

## 2014-10-14 ENCOUNTER — Other Ambulatory Visit: Payer: Self-pay | Admitting: Orthopedic Surgery

## 2014-10-15 ENCOUNTER — Encounter (HOSPITAL_BASED_OUTPATIENT_CLINIC_OR_DEPARTMENT_OTHER)
Admission: RE | Disposition: A | Discharge status: Home / Self Care | Payer: Self-pay | Source: Ambulatory Visit | Attending: Orthopedic Surgery

## 2014-10-15 ENCOUNTER — Ambulatory Visit (HOSPITAL_BASED_OUTPATIENT_CLINIC_OR_DEPARTMENT_OTHER): Payer: Managed Care, Other (non HMO) | Admitting: Anesthesiology

## 2014-10-15 ENCOUNTER — Ambulatory Visit (HOSPITAL_BASED_OUTPATIENT_CLINIC_OR_DEPARTMENT_OTHER)
Admission: RE | Admit: 2014-10-15 | Discharge: 2014-10-15 | Disposition: A | Discharge status: Home / Self Care | Payer: Managed Care, Other (non HMO) | Source: Ambulatory Visit | Attending: Orthopedic Surgery | Admitting: Orthopedic Surgery

## 2014-10-15 ENCOUNTER — Encounter (HOSPITAL_BASED_OUTPATIENT_CLINIC_OR_DEPARTMENT_OTHER): Payer: Self-pay | Admitting: Certified Registered"

## 2014-10-15 DIAGNOSIS — Z87891 Personal history of nicotine dependence: Secondary | ICD-10-CM | POA: Insufficient documentation

## 2014-10-15 DIAGNOSIS — M23303 Other meniscus derangements, unspecified medial meniscus, right knee: Secondary | ICD-10-CM | POA: Diagnosis present

## 2014-10-15 DIAGNOSIS — I10 Essential (primary) hypertension: Secondary | ICD-10-CM | POA: Diagnosis not present

## 2014-10-15 DIAGNOSIS — F419 Anxiety disorder, unspecified: Secondary | ICD-10-CM | POA: Insufficient documentation

## 2014-10-15 HISTORY — DX: Major depressive disorder, single episode, unspecified: F32.9

## 2014-10-15 HISTORY — PX: KNEE ARTHROSCOPY WITH LATERAL MENISECTOMY: SHX6193

## 2014-10-15 HISTORY — DX: Depression, unspecified: F32.A

## 2014-10-15 SURGERY — ARTHROSCOPY, KNEE, WITH LATERAL MENISCECTOMY
Anesthesia: General | Site: Knee | Laterality: Right

## 2014-10-15 MED ORDER — MIDAZOLAM HCL 2 MG/2ML IJ SOLN: 1.0000 mg | INTRAMUSCULAR | Status: DC | PRN

## 2014-10-15 MED ORDER — ONDANSETRON HCL 4 MG/2ML IJ SOLN
INTRAMUSCULAR | Status: DC | PRN
  Administered 2014-10-15: 4 mg via INTRAVENOUS

## 2014-10-15 MED ORDER — FENTANYL CITRATE (PF) 100 MCG/2ML IJ SOLN
25.0000 ug | INTRAMUSCULAR | Status: DC | PRN
  Administered 2014-10-15: 50 ug via INTRAVENOUS
  Administered 2014-10-15: 25 ug via INTRAVENOUS
  Administered 2014-10-15: 50 ug via INTRAVENOUS

## 2014-10-15 MED ORDER — GLYCOPYRROLATE 0.2 MG/ML IJ SOLN: 0.2000 mg | Freq: Once | INTRAMUSCULAR | Status: DC | PRN

## 2014-10-15 MED ORDER — CEFAZOLIN SODIUM-DEXTROSE 2-3 GM-% IV SOLR
INTRAVENOUS | Status: AC
  Filled 2014-10-15: qty 50

## 2014-10-15 MED ORDER — OXYCODONE-ACETAMINOPHEN 5-325 MG PO TABS: 1.0000 | ORAL_TABLET | Freq: Four times a day (QID) | ORAL | Status: AC | PRN

## 2014-10-15 MED ORDER — FENTANYL CITRATE (PF) 100 MCG/2ML IJ SOLN
INTRAMUSCULAR | Status: AC
  Filled 2014-10-15: qty 2

## 2014-10-15 MED ORDER — LACTATED RINGERS IV SOLN
INTRAVENOUS | Status: DC | PRN
  Administered 2014-10-15 (×2): via INTRAVENOUS

## 2014-10-15 MED ORDER — FENTANYL CITRATE (PF) 100 MCG/2ML IJ SOLN
INTRAMUSCULAR | Status: AC
  Filled 2014-10-15: qty 4

## 2014-10-15 MED ORDER — MIDAZOLAM HCL 2 MG/2ML IJ SOLN
INTRAMUSCULAR | Status: AC
  Filled 2014-10-15: qty 4

## 2014-10-15 MED ORDER — PROPOFOL 10 MG/ML IV BOLUS
INTRAVENOUS | Status: DC | PRN
  Administered 2014-10-15: 250 mg via INTRAVENOUS

## 2014-10-15 MED ORDER — BUPIVACAINE HCL (PF) 0.5 % IJ SOLN
INTRAMUSCULAR | Status: DC | PRN
  Administered 2014-10-15: 20 mL

## 2014-10-15 MED ORDER — FENTANYL CITRATE (PF) 100 MCG/2ML IJ SOLN: 50.0000 ug | INTRAMUSCULAR | Status: DC | PRN

## 2014-10-15 MED ORDER — LACTATED RINGERS IV SOLN: INTRAVENOUS | Status: DC

## 2014-10-15 MED ORDER — DEXAMETHASONE SODIUM PHOSPHATE 4 MG/ML IJ SOLN
INTRAMUSCULAR | Status: DC | PRN
  Administered 2014-10-15: 10 mg via INTRAVENOUS

## 2014-10-15 MED ORDER — FENTANYL CITRATE (PF) 100 MCG/2ML IJ SOLN: INTRAMUSCULAR | Status: DC | PRN

## 2014-10-15 MED ORDER — PROMETHAZINE HCL 25 MG/ML IJ SOLN: 6.2500 mg | INTRAMUSCULAR | Status: DC | PRN

## 2014-10-15 MED ORDER — OXYCODONE HCL 5 MG PO TABS: 5.0000 mg | ORAL_TABLET | Freq: Once | ORAL | Status: DC | PRN

## 2014-10-15 MED ORDER — SODIUM CHLORIDE 0.9 % IR SOLN
Status: DC | PRN
Start: 2014-10-15 — End: 2014-10-15
  Administered 2014-10-15: 6000 mL

## 2014-10-15 MED ORDER — CEFAZOLIN SODIUM-DEXTROSE 2-3 GM-% IV SOLR
2.0000 g | INTRAVENOUS | Status: AC
  Administered 2014-10-15: 2 g via INTRAVENOUS

## 2014-10-15 MED ORDER — LIDOCAINE HCL (CARDIAC) 20 MG/ML IV SOLN
INTRAVENOUS | Status: AC
  Filled 2014-10-15: qty 5

## 2014-10-15 MED ORDER — EPINEPHRINE HCL 1 MG/ML IJ SOLN
INTRAMUSCULAR | Status: DC | PRN
  Administered 2014-10-15: 1 mg

## 2014-10-15 MED ORDER — FENTANYL CITRATE (PF) 100 MCG/2ML IJ SOLN
INTRAMUSCULAR | Status: DC | PRN
  Administered 2014-10-15 (×2): 50 ug via INTRAVENOUS

## 2014-10-15 MED ORDER — ONDANSETRON HCL 4 MG/2ML IJ SOLN
INTRAMUSCULAR | Status: AC
  Filled 2014-10-15: qty 2

## 2014-10-15 MED ORDER — CHLORHEXIDINE GLUCONATE 4 % EX LIQD: 60.0000 mL | Freq: Once | CUTANEOUS | Status: DC

## 2014-10-15 MED ORDER — MIDAZOLAM HCL 5 MG/5ML IJ SOLN
INTRAMUSCULAR | Status: DC | PRN
  Administered 2014-10-15: 2 mg via INTRAVENOUS

## 2014-10-15 MED ORDER — OXYCODONE HCL 5 MG/5ML PO SOLN: 5.0000 mg | Freq: Once | ORAL | Status: DC | PRN

## 2014-10-15 MED ORDER — SCOPOLAMINE 1 MG/3DAYS TD PT72: 1.0000 | MEDICATED_PATCH | Freq: Once | TRANSDERMAL | Status: DC | PRN

## 2014-10-15 SURGICAL SUPPLY — 40 items
BANDAGE ELASTIC 6 VELCRO ST LF (GAUZE/BANDAGES/DRESSINGS) ×4 IMPLANT
BLADE 4.2CUDA (BLADE) IMPLANT
BLADE GREAT WHITE 4.2 (BLADE) ×3 IMPLANT
BLADE GREAT WHITE 4.2MM (BLADE) ×1
CUTTER MENISCUS  4.2MM (BLADE)
CUTTER MENISCUS 4.2MM (BLADE) IMPLANT
DRAPE ARTHROSCOPY W/POUCH 114 (DRAPES) ×4 IMPLANT
DRSG EMULSION OIL 3X3 NADH (GAUZE/BANDAGES/DRESSINGS) ×4 IMPLANT
DURAPREP 26ML APPLICATOR (WOUND CARE) ×4 IMPLANT
ELECT MENISCUS 165MM 90D (ELECTRODE) IMPLANT
ELECT REM PT RETURN 9FT ADLT (ELECTROSURGICAL)
ELECTRODE REM PT RTRN 9FT ADLT (ELECTROSURGICAL) IMPLANT
GAUZE SPONGE 4X4 12PLY STRL (GAUZE/BANDAGES/DRESSINGS) ×4 IMPLANT
GLOVE BIOGEL PI IND STRL 7.0 (GLOVE) ×1 IMPLANT
GLOVE BIOGEL PI IND STRL 8 (GLOVE) ×4 IMPLANT
GLOVE BIOGEL PI INDICATOR 7.0 (GLOVE) ×2
GLOVE BIOGEL PI INDICATOR 8 (GLOVE) ×4
GLOVE ECLIPSE 6.5 STRL STRAW (GLOVE) ×3 IMPLANT
GLOVE ECLIPSE 7.5 STRL STRAW (GLOVE) ×8 IMPLANT
GOWN STRL REUS W/ TWL LRG LVL3 (GOWN DISPOSABLE) ×2 IMPLANT
GOWN STRL REUS W/ TWL XL LVL3 (GOWN DISPOSABLE) ×2 IMPLANT
GOWN STRL REUS W/TWL LRG LVL3 (GOWN DISPOSABLE) ×4
GOWN STRL REUS W/TWL XL LVL3 (GOWN DISPOSABLE) ×8 IMPLANT
HOLDER KNEE FOAM BLUE (MISCELLANEOUS) ×4 IMPLANT
KNEE WRAP E Z 3 GEL PACK (MISCELLANEOUS) ×4 IMPLANT
MANIFOLD NEPTUNE II (INSTRUMENTS) ×3 IMPLANT
NDL SAFETY ECLIPSE 18X1.5 (NEEDLE) ×1 IMPLANT
NEEDLE HYPO 18GX1.5 SHARP (NEEDLE) ×4
PACK ARTHROSCOPY DSU (CUSTOM PROCEDURE TRAY) ×4 IMPLANT
PACK BASIN DAY SURGERY FS (CUSTOM PROCEDURE TRAY) ×4 IMPLANT
PAD CAST 4YDX4 CTTN HI CHSV (CAST SUPPLIES) ×2 IMPLANT
PADDING CAST COTTON 4X4 STRL (CAST SUPPLIES) ×4
PENCIL BUTTON HOLSTER BLD 10FT (ELECTRODE) IMPLANT
SET ARTHROSCOPY TUBING (MISCELLANEOUS) ×4
SET ARTHROSCOPY TUBING LN (MISCELLANEOUS) ×2 IMPLANT
SUT ETHILON 4 0 PS 2 18 (SUTURE) IMPLANT
SYR 5ML LL (SYRINGE) ×4 IMPLANT
TOWEL OR 17X24 6PK STRL BLUE (TOWEL DISPOSABLE) ×4 IMPLANT
TOWEL OR NON WOVEN STRL DISP B (DISPOSABLE) ×1 IMPLANT
WATER STERILE IRR 1000ML POUR (IV SOLUTION) ×4 IMPLANT

## 2014-10-15 NOTE — Anesthesia Postprocedure Evaluation (Signed)
  Anesthesia Post-op Note  Patient: [name]  Procedure(s) Performed: Procedure(s) (LRB): RIGHT KNEE ARTHROSCOPY , medial plica, lateral menisectomy (Right)  Patient Location: PACU  Anesthesia Type: General  Level of Consciousness: awake and alert   Airway and Oxygen Therapy: Patient Spontanous Breathing  Post-op Pain: mild  Post-op Assessment: Post-op Vital signs reviewed, Patient's Cardiovascular Status Stable, Respiratory Function Stable, Patent Airway and No signs of Nausea or vomiting  Last Vitals:  Filed Vitals:   10/15/14 1100  BP: 123/68  Pulse: 77  Temp:   Resp: 18    Post-op Vital Signs: stable   Complications: No apparent anesthesia complications

## 2014-10-15 NOTE — Anesthesia Preprocedure Evaluation (Addendum)
Anesthesia Evaluation  Patient identified by MRN, date of birth, ID band Patient awake    Reviewed: Allergy & Precautions, H&P , NPO status , Patient's Chart, lab work & pertinent test results  History of Anesthesia Complications Negative for: history of anesthetic complications  Airway Mallampati: I  TM Distance: >3 FB Neck ROM: full    Dental no notable dental hx.    Pulmonary former smoker,  breath sounds clear to auscultation  Pulmonary exam normal       Cardiovascular hypertension, Pt. on medications Normal cardiovascular examRhythm:regular Rate:Normal     Neuro/Psych Anxiety    GI/Hepatic negative GI ROS, Neg liver ROS,   Endo/Other  negative endocrine ROS  Renal/GU negative Renal ROS     Musculoskeletal   Abdominal (+) - obese,   Peds  Hematology negative hematology ROS (+)   Anesthesia Other Findings 12/2011 normal stress echo  NPO appropriate, allergies reviewed Denies active cardiac or pulmonary symptoms, METS > 4 No recent congestive cough or symptoms of upper respiratory infection She has been working out, lost weight, BP better controlled, no further headaches   Reproductive/Obstetrics negative OB ROS                            Anesthesia Physical Anesthesia Plan  ASA: II  Anesthesia Plan: General   Post-op Pain Management:    Induction: Intravenous  Airway Management Planned: LMA  Additional Equipment:   Intra-op Plan:   Post-operative Plan: Extubation in OR  Informed Consent: I have reviewed the patients History and Physical, chart, labs and discussed the procedure including the risks, benefits and alternatives for the proposed anesthesia with the patient or authorized representative who has indicated his/her understanding and acceptance.     Plan Discussed with: Anesthesiologist, CRNA and Surgeon  Anesthesia Plan Comments:         Anesthesia  Quick Evaluation

## 2014-10-15 NOTE — Transfer of Care (Signed)
Immediate Anesthesia Transfer of Care Note  Patient: [name]  Procedure(s) Performed: Procedure(s): RIGHT KNEE ARTHROSCOPY , medial plica, lateral menisectomy (Right)  Patient Location: PACU  Anesthesia Type:General  Level of Consciousness: awake, alert  and oriented  Airway & Oxygen Therapy: Patient Spontanous Breathing and Patient connected to face mask oxygen  Post-op Assessment: Report given to RN, Post -op Vital signs reviewed and stable and Patient moving all extremities  Post vital signs: Reviewed and stable  Last Vitals:  Filed Vitals:   10/15/14 0843  BP: 129/82  Pulse: 97  Temp: 36.8 C  Resp: 16    Complications: No apparent anesthesia complications

## 2014-10-15 NOTE — Brief Op Note (Signed)
10/15/2014  10:24 AM  PATIENT:  [name]  54 y.o. female  PRE-OPERATIVE DIAGNOSIS:  probable medial meniscus tear right knee  POST-OPERATIVE DIAGNOSIS:  probable medial meniscus tear right knee  PROCEDURE:  Procedure(s): RIGHT KNEE ARTHROSCOPY , medial plica, lateral menisectomy (Right)  SURGEON:  Surgeon(s) and Role:    * Jodi Geralds, MD - Primary  PHYSICIAN ASSISTANT:   ASSISTANTS: bethune   ANESTHESIA:   general  EBL:  Total I/O In: 100 [I.V.:100] Out: -   BLOOD ADMINISTERED:none  DRAINS: none   LOCAL MEDICATIONS USED:  MARCAINE     SPECIMEN:  No Specimen  DISPOSITION OF SPECIMEN:  N/A  COUNTS:  YES  TOURNIQUET:  * No tourniquets in log *  DICTATION: .Other Dictation: Dictation Number [id number]  PLAN OF CARE: Discharge to home after PACU  PATIENT DISPOSITION:  PACU - hemodynamically stable.   Delay start of Pharmacological VTE agent (>24hrs) due to surgical blood loss or risk of bleeding: no

## 2014-10-15 NOTE — Anesthesia Procedure Notes (Signed)
Procedure Name: LMA Insertion Date/Time: 10/15/2014 9:52 AM Performed by: Curly Shores Pre-anesthesia Checklist: Patient identified, Emergency Drugs available, Suction available and Patient being monitored Patient Re-evaluated:Patient Re-evaluated prior to inductionOxygen Delivery Method: Circle System Utilized Preoxygenation: Pre-oxygenation with 100% oxygen Intubation Type: IV induction Ventilation: Mask ventilation without difficulty LMA: LMA inserted LMA Size: 4.0 Number of attempts: 1 Airway Equipment and Method: Bite block Placement Confirmation: positive ETCO2 and breath sounds checked- equal and bilateral Tube secured with: Tape Dental Injury: Teeth and Oropharynx as per pre-operative assessment

## 2014-10-15 NOTE — H&P (Signed)
PREOPERATIVE H&P  Chief Complaint: r knee pain  HPI: [name] is a 54 y.o. female who presents for evaluation of r knee pain. It has been present for greater than 6 months and has been worsening. She has failed conservative measures. Pain is rated as moderate.  Past Medical History  Diagnosis Date  . Headache(784.0)   . Cellulitis and abscess of face   . Generalized anxiety disorder   . Unspecified essential hypertension   . Obesity, unspecified   . Nonspecific abnormal results of thyroid function study   . Anxiety   . Depression    Past Surgical History  Procedure Laterality Date  . Rotator cuff repair  2010  . Gallbladder surgery  2006  . Laparoscopic hysterectomy  2004   Social History   Social History  . Marital Status: Married    Spouse Name: N/A  . Number of Children: N/A  . Years of Education: N/A   Social History Main Topics  . Smoking status: Former Smoker    Quit date: 02/28/1992  . Smokeless tobacco: None  . Alcohol Use: Yes  . Drug Use: No  . Sexual Activity: Yes   Other Topics Concern  . None   Social History Narrative   Family History  Problem Relation Age of Onset  . Hypertension Father   . Cancer Paternal Grandmother   . Thyroid disease Brother   . Thyroid disease Brother   . Diabetes Maternal Grandfather   . Glaucoma Paternal Grandfather   . Irritable bowel syndrome Father   . Tremor Father    Allergies  Allergen Reactions  . Sulfa Antibiotics Anaphylaxis   Prior to Admission medications   Medication Sig Start Date End Date Taking? Authorizing Provider  Probiotic Product (ULTRAFLORA IMMUNE HEALTH PO) Take by mouth.   Yes Historical Provider, MD  Rhubarb (ESTROVERA PO) Take by mouth.   Yes Historical Provider, MD  valsartan (DIOVAN) 80 MG tablet Take 80 mg by mouth daily.    Historical Provider, MD  venlafaxine XR (EFFEXOR-XR) 75 MG 24 hr capsule Take 75 mg by mouth daily with breakfast.    Historical Provider, MD     Positive  ROS: none  All other systems have been reviewed and were otherwise negative with the exception of those mentioned in the HPI and as above.  Physical Exam: There were no vitals filed for this visit.  General: Alert, no acute distress Cardiovascular: No pedal edema Respiratory: No cyanosis, no use of accessory musculature GI: No organomegaly, abdomen is soft and non-tender Skin: No lesions in the area of chief complaint Neurologic: Sensation intact distally Psychiatric: Patient is competent for consent with normal mood and affect Lymphatic: No axillary or cervical lymphadenopathy  MUSCULOSKELETAL: r knee: painful rom//no instability// med jt line tender trace effusion  Assessment/Plan: probable medial meniscus tear right knee Plan for Procedure(s): RIGHT KNEE ARTHROSCOPY   The risks benefits and alternatives were discussed with the patient including but not limited to the risks of nonoperative treatment, versus surgical intervention including infection, bleeding, nerve injury, malunion, nonunion, hardware prominence, hardware failure, need for hardware removal, blood clots, cardiopulmonary complications, morbidity, mortality, among others, and they were willing to proceed.  Predicted outcome is good, although there will be at least a six to nine month expected recovery.  Tamlyn Sides L, MD 10/15/2014 8:33 AM

## 2014-10-15 NOTE — Discharge Instructions (Signed)
POST-OP KNEE ARTHROSCOPY INSTRUCTIONS  °Dr. John Graves/Jim Bethune PA-C ° °Pain °You will be expected to have a moderate amount of pain in the affected knee for approximately two weeks. However, the first two days will be the most severe pain. A prescription has been provided to take as needed for the pain. The pain can be reduced by applying ice packs to the knee for the first 1-2 weeks post surgery. Also, keeping the leg elevated on pillows will help alleviate the pain. If you develop any acute pain or swelling in your calf muscle, please call the doctor. ° °Activity °It is preferred that you stay at bed rest for approximately 24 hours. However, you may go to the bathroom with help. Weight bearing as tolerated. You may begin the knee exercises the day of surgery. Discontinue crutches as the knee pain resolves. ° °Dressing °Keep the dressing dry. If the ace bandage should wrinkle or roll up, this can be rewrapped to prevent ridges in the bandage. You may remove all dressings in 48 hours,  apply bandaids to each wound. You may shower on the 4th day after surgery but no tub bath. ° °Symptoms to report to your doctor °Extreme pain °Extreme swelling °Temperature above 101 degrees °Change in the feeling, color, or movement of your toes °Redness, heat, or swelling at your incision ° °Exercise °If is preferred that as soon as possible you try to do a straight leg raise without bending the knee and concentrate on bringing the heel of your foot off the bed up to approximately 45 degrees and hold for the count of 10 seconds. Repeat this at least 10 times three or four times per day. Additional exercises are provided below. ° °You are encouraged to bend the knee as tolerated. ° °Follow-Up °Call to schedule a follow-up appointment in 5-7 days. Our office # is [phone]. ° °POST-OP EXERCISES ° °Short Arc Quads ° °1. Lie on back with legs straight. Place towel roll under thigh, just above knee. °2. Tighten thigh muscles to  straighten knee and lift heel off bed. °3. Hold for slow count of five, then lower. °4. Do three sets of ten ° ° ° °Straight Leg Raises ° °1. Lie on back with operative leg straight and other leg bent. °2. Keeping operative leg completely straight, slowly lift operative leg so foot is 5 inches off bed. °3. Hold for slow count of five, then lower. °4. Do three sets of ten. ° ° ° °DO BOTH EXERCISES 2 TIMES A DAY ° °Ankle Pumps ° °Work/move the operative ankle and foot up and down 10 times every hour while awake. ° ° °Post Anesthesia Home Care Instructions ° °Activity: °Get plenty of rest for the remainder of the day. A responsible adult should stay with you for 24 hours following the procedure.  °For the next 24 hours, DO NOT: °-Drive a car °-Operate machinery °-Drink alcoholic beverages °-Take any medication unless instructed by your physician °-Make any legal decisions or sign important papers. ° °Meals: °Start with liquid foods such as gelatin or soup. Progress to regular foods as tolerated. Avoid greasy, spicy, heavy foods. If nausea and/or vomiting occur, drink only clear liquids until the nausea and/or vomiting subsides. Call your physician if vomiting continues. ° °Special Instructions/Symptoms: °Your throat may feel dry or sore from the anesthesia or the breathing tube placed in your throat during surgery. If this causes discomfort, gargle with warm salt water. The discomfort should disappear within 24 hours. ° °If you   had a scopolamine patch placed behind your ear for the management of post- operative nausea and/or vomiting: ° °1. The medication in the patch is effective for 72 hours, after which it should be removed.  Wrap patch in a tissue and discard in the trash. Wash hands thoroughly with soap and water. °2. You may remove the patch earlier than 72 hours if you experience unpleasant side effects which may include dry mouth, dizziness or visual disturbances. °3. Avoid touching the patch. Wash your hands  with soap and water after contact with the patch. °  ° °

## 2014-10-16 ENCOUNTER — Encounter (HOSPITAL_BASED_OUTPATIENT_CLINIC_OR_DEPARTMENT_OTHER): Payer: Self-pay | Admitting: Orthopedic Surgery

## 2014-10-16 NOTE — Op Note (Signed)
[name], [name]                  ACCOUNT NO.:  [id number]  MEDICAL RECORD NO.:  [id number]  LOCATION:                               FACILITY:  [facility]  PHYSICIAN:  Harvie Junior, M.D.   DATE OF BIRTH:  April 12, 1960  DATE OF PROCEDURE:  10/15/2014 DATE OF DISCHARGE:  10/15/2014                              OPERATIVE REPORT   PREOPERATIVE DIAGNOSIS:  Medial meniscal tear.  POSTOPERATIVE DIAGNOSES: 1. 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 2. Lateral meniscal tear. 3. Medial shelf plica.  PROCEDURE: 1. Chondroplasty medial femoral condyle, down to bleeding bone were     necessary. 2. Chondroplasty patellofemoral trochlea, down to bleeding bone were     necessary. 3. Partial lateral meniscectomy. 4. Medial plica excision.  SURGEON:  Harvie Junior, M.D.  ASSISTANT:  Marshia Ly, PA  ANESTHESIA:  General.  BRIEF HISTORY:  [name] is a 54 year old female with a long history of significant complaints of right knee pain.  She has been treated conservatively for a prolonged period of time after failure of conservative care.  She was taken to the operating room for right knee arthroscopy.  DESCRIPTION OF PROCEDURE:  The patient was brought to the operating room.  After adequate anesthesia was obtained with general anesthetic, the patient was placed supine on the operating table.  The right leg was prepped and draped in usual sterile fashion.  Following this, routine arthroscopic examination of the knee revealed that there was significant chondromalacia of the medial femoral condyle, anteromedially and into the trochlea.  This was debrided back to a smooth and stable rim, where articular cartilage down to bleeding bone were necessary.  Attention turned to the medial compartment.  There was a large medial plica and this was debrided back to the rim of the wall.  This large medial plica was excised back to the wall, which gave access into the  medial compartment.  The medial meniscus was probed at length and felt to be normal.  The medial femoral condyle had some grade II, grade III, and some small areas of grade IV change.  This was debrided back to a smooth and stable rim, where articular cartilage down to bleeding bone were necessary.  Attention was then turned to the notch, where the ACL was normal.  Attention turned to the lateral compartment, where there were still some chondral pieces that were floating throughout the knee, which had been identified multiple times open throughout the knee.  The lateral meniscus had a fray on the outer horn and we started to probe back in that area.  It did appear that it was sort of an impending tear, we then did a debridement of the posterior horn back to a smooth and stable rim, put the shaver over there and were continued to remove small cartilaginous loose bodies from the knee joint.  Once we saw no more of these, we went back medially and milked the back of the knee.  They get rid of this as well.  Once these were all non-tension of patellofemoral joint, where we then did another irrigation for up to 6 L normal saline irrigation, just to  make sure that we got as many of these small fragments of articular cartilage out as we could.  At this point, the knee was copiously and thoroughly lavaged, suctioned dry.  The portals were closed with a bandage.  A 20 mL of 0.25% Marcaine was instilled in the knee for postoperative anesthesia.  The patient was taken to the recovery room in satisfactory condition.  Estimated blood loss for the procedure was minimal.     Harvie Junior, M.D.     Ranae Plumber  D:  10/15/2014  T:  10/15/2014  Job:  [id number]

## 2015-07-21 ENCOUNTER — Other Ambulatory Visit: Payer: Self-pay | Admitting: Family Medicine

## 2015-07-21 DIAGNOSIS — Z1231 Encounter for screening mammogram for malignant neoplasm of breast: Secondary | ICD-10-CM

## 2015-07-27 ENCOUNTER — Ambulatory Visit
Admission: RE | Admit: 2015-07-27 | Discharge: 2015-07-27 | Disposition: A | Discharge status: Home / Self Care | Payer: BLUE CROSS/BLUE SHIELD | Source: Ambulatory Visit | Attending: Family Medicine | Admitting: Family Medicine

## 2015-07-27 DIAGNOSIS — Z1231 Encounter for screening mammogram for malignant neoplasm of breast: Secondary | ICD-10-CM

## 2015-11-19 ENCOUNTER — Other Ambulatory Visit: Payer: Self-pay | Admitting: Nurse Practitioner

## 2015-11-19 DIAGNOSIS — R1030 Lower abdominal pain, unspecified: Secondary | ICD-10-CM

## 2015-11-25 ENCOUNTER — Ambulatory Visit
Admission: RE | Admit: 2015-11-25 | Discharge: 2015-11-25 | Disposition: A | Discharge status: Home / Self Care | Payer: BLUE CROSS/BLUE SHIELD | Source: Ambulatory Visit | Attending: Nurse Practitioner | Admitting: Nurse Practitioner

## 2015-11-25 DIAGNOSIS — R1030 Lower abdominal pain, unspecified: Secondary | ICD-10-CM

## 2015-11-25 MED ORDER — IOPAMIDOL (ISOVUE-300) INJECTION 61%
100.0000 mL | Freq: Once | INTRAVENOUS | Status: AC | PRN
  Administered 2015-11-25: 100 mL via INTRAVENOUS

## 2016-05-30 DIAGNOSIS — R1084 Generalized abdominal pain: Secondary | ICD-10-CM | POA: Diagnosis not present

## 2016-05-30 DIAGNOSIS — M545 Low back pain: Secondary | ICD-10-CM | POA: Diagnosis not present

## 2016-06-01 DIAGNOSIS — R1031 Right lower quadrant pain: Secondary | ICD-10-CM | POA: Diagnosis not present

## 2016-06-03 ENCOUNTER — Other Ambulatory Visit: Payer: Self-pay | Admitting: Family Medicine

## 2016-06-03 DIAGNOSIS — R1031 Right lower quadrant pain: Secondary | ICD-10-CM

## 2016-06-10 ENCOUNTER — Ambulatory Visit
Admission: RE | Admit: 2016-06-10 | Discharge: 2016-06-10 | Disposition: A | Discharge status: Home / Self Care | Payer: BLUE CROSS/BLUE SHIELD | Source: Ambulatory Visit | Attending: Family Medicine | Admitting: Family Medicine

## 2016-06-10 DIAGNOSIS — R1031 Right lower quadrant pain: Secondary | ICD-10-CM | POA: Diagnosis not present

## 2016-06-10 MED ORDER — IOPAMIDOL (ISOVUE-300) INJECTION 61%
125.0000 mL | Freq: Once | INTRAVENOUS | Status: AC | PRN
  Administered 2016-06-10: 125 mL via INTRAVENOUS

## 2016-07-04 DIAGNOSIS — R319 Hematuria, unspecified: Secondary | ICD-10-CM | POA: Diagnosis not present

## 2016-07-04 DIAGNOSIS — R102 Pelvic and perineal pain: Secondary | ICD-10-CM | POA: Diagnosis not present

## 2016-07-08 DIAGNOSIS — R198 Other specified symptoms and signs involving the digestive system and abdomen: Secondary | ICD-10-CM | POA: Diagnosis not present

## 2016-07-08 DIAGNOSIS — R1030 Lower abdominal pain, unspecified: Secondary | ICD-10-CM | POA: Diagnosis not present

## 2016-08-19 DIAGNOSIS — K582 Mixed irritable bowel syndrome: Secondary | ICD-10-CM | POA: Diagnosis not present

## 2016-08-19 DIAGNOSIS — R198 Other specified symptoms and signs involving the digestive system and abdomen: Secondary | ICD-10-CM | POA: Diagnosis not present

## 2016-08-19 DIAGNOSIS — R1030 Lower abdominal pain, unspecified: Secondary | ICD-10-CM | POA: Diagnosis not present

## 2016-10-11 ENCOUNTER — Other Ambulatory Visit: Payer: Self-pay | Admitting: Family Medicine

## 2016-10-11 DIAGNOSIS — Z1231 Encounter for screening mammogram for malignant neoplasm of breast: Secondary | ICD-10-CM

## 2016-10-24 ENCOUNTER — Ambulatory Visit
Admission: RE | Admit: 2016-10-24 | Discharge: 2016-10-24 | Disposition: A | Discharge status: Home / Self Care | Payer: BLUE CROSS/BLUE SHIELD | Source: Ambulatory Visit | Attending: Family Medicine | Admitting: Family Medicine

## 2016-10-24 DIAGNOSIS — Z1231 Encounter for screening mammogram for malignant neoplasm of breast: Secondary | ICD-10-CM | POA: Diagnosis not present

## 2017-01-31 DIAGNOSIS — R079 Chest pain, unspecified: Secondary | ICD-10-CM | POA: Diagnosis not present

## 2017-01-31 DIAGNOSIS — M546 Pain in thoracic spine: Secondary | ICD-10-CM | POA: Diagnosis not present

## 2017-03-13 DIAGNOSIS — Z86010 Personal history of colonic polyps: Secondary | ICD-10-CM | POA: Diagnosis not present

## 2017-03-13 DIAGNOSIS — K582 Mixed irritable bowel syndrome: Secondary | ICD-10-CM | POA: Diagnosis not present

## 2017-03-28 DIAGNOSIS — M25562 Pain in left knee: Secondary | ICD-10-CM | POA: Diagnosis not present

## 2017-03-28 DIAGNOSIS — M67911 Unspecified disorder of synovium and tendon, right shoulder: Secondary | ICD-10-CM | POA: Diagnosis not present

## 2017-05-01 DIAGNOSIS — I10 Essential (primary) hypertension: Secondary | ICD-10-CM | POA: Diagnosis not present

## 2017-05-01 DIAGNOSIS — N951 Menopausal and female climacteric states: Secondary | ICD-10-CM | POA: Diagnosis not present

## 2017-05-01 DIAGNOSIS — F411 Generalized anxiety disorder: Secondary | ICD-10-CM | POA: Diagnosis not present

## 2017-05-12 DIAGNOSIS — L298 Other pruritus: Secondary | ICD-10-CM | POA: Diagnosis not present

## 2017-05-18 DIAGNOSIS — M67911 Unspecified disorder of synovium and tendon, right shoulder: Secondary | ICD-10-CM | POA: Diagnosis not present

## 2017-05-30 ENCOUNTER — Other Ambulatory Visit: Payer: Self-pay

## 2017-05-30 DIAGNOSIS — D485 Neoplasm of uncertain behavior of skin: Secondary | ICD-10-CM | POA: Diagnosis not present

## 2017-07-04 DIAGNOSIS — E669 Obesity, unspecified: Secondary | ICD-10-CM | POA: Diagnosis not present

## 2017-07-04 DIAGNOSIS — I10 Essential (primary) hypertension: Secondary | ICD-10-CM | POA: Diagnosis not present

## 2017-07-04 DIAGNOSIS — Z1159 Encounter for screening for other viral diseases: Secondary | ICD-10-CM | POA: Diagnosis not present

## 2017-07-04 DIAGNOSIS — R7303 Prediabetes: Secondary | ICD-10-CM | POA: Diagnosis not present

## 2017-08-10 DIAGNOSIS — M67911 Unspecified disorder of synovium and tendon, right shoulder: Secondary | ICD-10-CM | POA: Diagnosis not present

## 2017-08-10 DIAGNOSIS — M542 Cervicalgia: Secondary | ICD-10-CM | POA: Diagnosis not present

## 2017-08-19 DIAGNOSIS — M25511 Pain in right shoulder: Secondary | ICD-10-CM | POA: Diagnosis not present

## 2017-08-24 DIAGNOSIS — M24811 Other specific joint derangements of right shoulder, not elsewhere classified: Secondary | ICD-10-CM | POA: Diagnosis not present

## 2017-08-24 DIAGNOSIS — M75121 Complete rotator cuff tear or rupture of right shoulder, not specified as traumatic: Secondary | ICD-10-CM | POA: Diagnosis not present

## 2017-10-04 DIAGNOSIS — M19011 Primary osteoarthritis, right shoulder: Secondary | ICD-10-CM | POA: Diagnosis not present

## 2017-10-04 DIAGNOSIS — Y929 Unspecified place or not applicable: Secondary | ICD-10-CM | POA: Diagnosis not present

## 2017-10-04 DIAGNOSIS — S4381XA Sprain of other specified parts of right shoulder girdle, initial encounter: Secondary | ICD-10-CM | POA: Diagnosis not present

## 2017-10-04 DIAGNOSIS — S46011A Strain of muscle(s) and tendon(s) of the rotator cuff of right shoulder, initial encounter: Secondary | ICD-10-CM | POA: Diagnosis not present

## 2017-10-04 DIAGNOSIS — M7521 Bicipital tendinitis, right shoulder: Secondary | ICD-10-CM | POA: Diagnosis not present

## 2017-10-04 DIAGNOSIS — Y999 Unspecified external cause status: Secondary | ICD-10-CM | POA: Diagnosis not present

## 2017-10-04 DIAGNOSIS — G8918 Other acute postprocedural pain: Secondary | ICD-10-CM | POA: Diagnosis not present

## 2017-10-04 DIAGNOSIS — M7541 Impingement syndrome of right shoulder: Secondary | ICD-10-CM | POA: Diagnosis not present

## 2017-10-04 DIAGNOSIS — S43491A Other sprain of right shoulder joint, initial encounter: Secondary | ICD-10-CM | POA: Diagnosis not present

## 2017-10-04 DIAGNOSIS — X58XXXA Exposure to other specified factors, initial encounter: Secondary | ICD-10-CM | POA: Diagnosis not present

## 2017-10-10 DIAGNOSIS — M75121 Complete rotator cuff tear or rupture of right shoulder, not specified as traumatic: Secondary | ICD-10-CM | POA: Diagnosis not present

## 2017-10-10 DIAGNOSIS — Z9889 Other specified postprocedural states: Secondary | ICD-10-CM | POA: Diagnosis not present

## 2017-10-12 DIAGNOSIS — M75121 Complete rotator cuff tear or rupture of right shoulder, not specified as traumatic: Secondary | ICD-10-CM | POA: Diagnosis not present

## 2017-10-17 DIAGNOSIS — M75121 Complete rotator cuff tear or rupture of right shoulder, not specified as traumatic: Secondary | ICD-10-CM | POA: Diagnosis not present

## 2017-10-19 DIAGNOSIS — M75121 Complete rotator cuff tear or rupture of right shoulder, not specified as traumatic: Secondary | ICD-10-CM | POA: Diagnosis not present

## 2017-10-23 DIAGNOSIS — M75121 Complete rotator cuff tear or rupture of right shoulder, not specified as traumatic: Secondary | ICD-10-CM | POA: Diagnosis not present

## 2017-10-26 DIAGNOSIS — M75121 Complete rotator cuff tear or rupture of right shoulder, not specified as traumatic: Secondary | ICD-10-CM | POA: Diagnosis not present

## 2017-10-30 DIAGNOSIS — L509 Urticaria, unspecified: Secondary | ICD-10-CM | POA: Diagnosis not present

## 2017-10-30 DIAGNOSIS — T7840XA Allergy, unspecified, initial encounter: Secondary | ICD-10-CM | POA: Diagnosis not present

## 2017-11-01 DIAGNOSIS — M75121 Complete rotator cuff tear or rupture of right shoulder, not specified as traumatic: Secondary | ICD-10-CM | POA: Diagnosis not present

## 2017-11-08 DIAGNOSIS — M75121 Complete rotator cuff tear or rupture of right shoulder, not specified as traumatic: Secondary | ICD-10-CM | POA: Diagnosis not present

## 2017-11-13 DIAGNOSIS — M75121 Complete rotator cuff tear or rupture of right shoulder, not specified as traumatic: Secondary | ICD-10-CM | POA: Diagnosis not present

## 2017-11-15 DIAGNOSIS — M75121 Complete rotator cuff tear or rupture of right shoulder, not specified as traumatic: Secondary | ICD-10-CM | POA: Diagnosis not present

## 2017-11-22 DIAGNOSIS — M75121 Complete rotator cuff tear or rupture of right shoulder, not specified as traumatic: Secondary | ICD-10-CM | POA: Diagnosis not present

## 2017-11-23 DIAGNOSIS — M75121 Complete rotator cuff tear or rupture of right shoulder, not specified as traumatic: Secondary | ICD-10-CM | POA: Diagnosis not present

## 2017-11-27 DIAGNOSIS — M75121 Complete rotator cuff tear or rupture of right shoulder, not specified as traumatic: Secondary | ICD-10-CM | POA: Diagnosis not present

## 2017-11-29 DIAGNOSIS — M75121 Complete rotator cuff tear or rupture of right shoulder, not specified as traumatic: Secondary | ICD-10-CM | POA: Diagnosis not present

## 2017-12-11 DIAGNOSIS — M75121 Complete rotator cuff tear or rupture of right shoulder, not specified as traumatic: Secondary | ICD-10-CM | POA: Diagnosis not present

## 2018-03-30 DIAGNOSIS — J0100 Acute maxillary sinusitis, unspecified: Secondary | ICD-10-CM | POA: Diagnosis not present

## 2018-03-30 DIAGNOSIS — J0110 Acute frontal sinusitis, unspecified: Secondary | ICD-10-CM | POA: Diagnosis not present

## 2018-03-30 DIAGNOSIS — J0120 Acute ethmoidal sinusitis, unspecified: Secondary | ICD-10-CM | POA: Diagnosis not present

## 2018-04-09 DIAGNOSIS — D225 Melanocytic nevi of trunk: Secondary | ICD-10-CM | POA: Diagnosis not present

## 2018-04-09 DIAGNOSIS — L814 Other melanin hyperpigmentation: Secondary | ICD-10-CM | POA: Diagnosis not present

## 2018-04-09 DIAGNOSIS — D2262 Melanocytic nevi of left upper limb, including shoulder: Secondary | ICD-10-CM | POA: Diagnosis not present

## 2018-04-09 DIAGNOSIS — D1801 Hemangioma of skin and subcutaneous tissue: Secondary | ICD-10-CM | POA: Diagnosis not present

## 2018-04-09 DIAGNOSIS — L820 Inflamed seborrheic keratosis: Secondary | ICD-10-CM | POA: Diagnosis not present

## 2018-04-24 DIAGNOSIS — M542 Cervicalgia: Secondary | ICD-10-CM | POA: Diagnosis not present

## 2018-06-28 DIAGNOSIS — M25561 Pain in right knee: Secondary | ICD-10-CM | POA: Diagnosis not present

## 2018-08-02 DIAGNOSIS — E663 Overweight: Secondary | ICD-10-CM | POA: Diagnosis not present

## 2018-08-02 DIAGNOSIS — I10 Essential (primary) hypertension: Secondary | ICD-10-CM | POA: Diagnosis not present

## 2018-08-02 DIAGNOSIS — F411 Generalized anxiety disorder: Secondary | ICD-10-CM | POA: Diagnosis not present

## 2018-08-02 DIAGNOSIS — R7303 Prediabetes: Secondary | ICD-10-CM | POA: Diagnosis not present

## 2018-09-21 DIAGNOSIS — G8929 Other chronic pain: Secondary | ICD-10-CM | POA: Diagnosis not present

## 2018-09-21 DIAGNOSIS — R5383 Other fatigue: Secondary | ICD-10-CM | POA: Diagnosis not present

## 2018-09-21 DIAGNOSIS — M546 Pain in thoracic spine: Secondary | ICD-10-CM | POA: Diagnosis not present

## 2018-09-21 DIAGNOSIS — G44219 Episodic tension-type headache, not intractable: Secondary | ICD-10-CM | POA: Diagnosis not present

## 2018-10-18 DIAGNOSIS — R7303 Prediabetes: Secondary | ICD-10-CM | POA: Diagnosis not present

## 2018-10-18 DIAGNOSIS — R5383 Other fatigue: Secondary | ICD-10-CM | POA: Diagnosis not present

## 2018-10-18 DIAGNOSIS — I10 Essential (primary) hypertension: Secondary | ICD-10-CM | POA: Diagnosis not present

## 2018-10-24 DIAGNOSIS — I10 Essential (primary) hypertension: Secondary | ICD-10-CM | POA: Diagnosis not present

## 2018-10-24 DIAGNOSIS — E7800 Pure hypercholesterolemia, unspecified: Secondary | ICD-10-CM | POA: Diagnosis not present

## 2018-10-24 DIAGNOSIS — E669 Obesity, unspecified: Secondary | ICD-10-CM | POA: Diagnosis not present

## 2018-10-24 DIAGNOSIS — R7303 Prediabetes: Secondary | ICD-10-CM | POA: Diagnosis not present

## 2018-11-07 ENCOUNTER — Other Ambulatory Visit: Payer: Self-pay | Admitting: Family Medicine

## 2018-11-07 DIAGNOSIS — Z1231 Encounter for screening mammogram for malignant neoplasm of breast: Secondary | ICD-10-CM

## 2018-11-12 ENCOUNTER — Ambulatory Visit
Admission: RE | Admit: 2018-11-12 | Discharge: 2018-11-12 | Disposition: A | Discharge status: Home / Self Care | Payer: BLUE CROSS/BLUE SHIELD | Source: Ambulatory Visit | Attending: Family Medicine | Admitting: Family Medicine

## 2018-11-12 ENCOUNTER — Other Ambulatory Visit: Payer: Self-pay

## 2018-11-12 DIAGNOSIS — Z1231 Encounter for screening mammogram for malignant neoplasm of breast: Secondary | ICD-10-CM | POA: Diagnosis not present

## 2018-12-18 ENCOUNTER — Ambulatory Visit: Payer: BC Managed Care – PPO | Admitting: Registered"

## 2018-12-18 DIAGNOSIS — N951 Menopausal and female climacteric states: Secondary | ICD-10-CM | POA: Diagnosis not present

## 2018-12-20 DIAGNOSIS — N951 Menopausal and female climacteric states: Secondary | ICD-10-CM | POA: Diagnosis not present

## 2018-12-20 DIAGNOSIS — Z72820 Sleep deprivation: Secondary | ICD-10-CM | POA: Diagnosis not present

## 2018-12-20 DIAGNOSIS — M2550 Pain in unspecified joint: Secondary | ICD-10-CM | POA: Diagnosis not present

## 2018-12-20 DIAGNOSIS — R232 Flushing: Secondary | ICD-10-CM | POA: Diagnosis not present

## 2019-01-17 DIAGNOSIS — N951 Menopausal and female climacteric states: Secondary | ICD-10-CM | POA: Diagnosis not present

## 2019-01-17 DIAGNOSIS — R232 Flushing: Secondary | ICD-10-CM | POA: Diagnosis not present

## 2019-01-17 DIAGNOSIS — Z72820 Sleep deprivation: Secondary | ICD-10-CM | POA: Diagnosis not present

## 2019-01-17 DIAGNOSIS — M2550 Pain in unspecified joint: Secondary | ICD-10-CM | POA: Diagnosis not present

## 2019-01-22 DIAGNOSIS — R071 Chest pain on breathing: Secondary | ICD-10-CM | POA: Diagnosis not present

## 2019-01-22 DIAGNOSIS — R05 Cough: Secondary | ICD-10-CM | POA: Diagnosis not present

## 2019-01-23 DIAGNOSIS — R7303 Prediabetes: Secondary | ICD-10-CM | POA: Diagnosis not present

## 2019-01-23 DIAGNOSIS — R071 Chest pain on breathing: Secondary | ICD-10-CM | POA: Diagnosis not present

## 2019-01-23 DIAGNOSIS — E7800 Pure hypercholesterolemia, unspecified: Secondary | ICD-10-CM | POA: Diagnosis not present

## 2019-01-25 DIAGNOSIS — N951 Menopausal and female climacteric states: Secondary | ICD-10-CM | POA: Diagnosis not present

## 2019-01-25 DIAGNOSIS — M2550 Pain in unspecified joint: Secondary | ICD-10-CM | POA: Diagnosis not present

## 2019-01-25 DIAGNOSIS — R5382 Chronic fatigue, unspecified: Secondary | ICD-10-CM | POA: Diagnosis not present

## 2019-04-02 DIAGNOSIS — L570 Actinic keratosis: Secondary | ICD-10-CM | POA: Diagnosis not present

## 2019-04-11 DIAGNOSIS — M2550 Pain in unspecified joint: Secondary | ICD-10-CM | POA: Diagnosis not present

## 2019-04-11 DIAGNOSIS — N951 Menopausal and female climacteric states: Secondary | ICD-10-CM | POA: Diagnosis not present

## 2019-04-11 DIAGNOSIS — R5382 Chronic fatigue, unspecified: Secondary | ICD-10-CM | POA: Diagnosis not present

## 2019-04-11 DIAGNOSIS — E039 Hypothyroidism, unspecified: Secondary | ICD-10-CM | POA: Diagnosis not present

## 2019-04-17 DIAGNOSIS — Z72820 Sleep deprivation: Secondary | ICD-10-CM | POA: Diagnosis not present

## 2019-04-17 DIAGNOSIS — N951 Menopausal and female climacteric states: Secondary | ICD-10-CM | POA: Diagnosis not present

## 2019-04-17 DIAGNOSIS — R232 Flushing: Secondary | ICD-10-CM | POA: Diagnosis not present

## 2019-04-17 DIAGNOSIS — M2550 Pain in unspecified joint: Secondary | ICD-10-CM | POA: Diagnosis not present

## 2019-06-10 DIAGNOSIS — R232 Flushing: Secondary | ICD-10-CM | POA: Diagnosis not present

## 2019-06-10 DIAGNOSIS — N951 Menopausal and female climacteric states: Secondary | ICD-10-CM | POA: Diagnosis not present

## 2019-06-27 DIAGNOSIS — H04301 Unspecified dacryocystitis of right lacrimal passage: Secondary | ICD-10-CM | POA: Diagnosis not present

## 2019-07-09 DIAGNOSIS — E663 Overweight: Secondary | ICD-10-CM | POA: Diagnosis not present

## 2019-07-09 DIAGNOSIS — E7800 Pure hypercholesterolemia, unspecified: Secondary | ICD-10-CM | POA: Diagnosis not present

## 2019-07-09 DIAGNOSIS — F411 Generalized anxiety disorder: Secondary | ICD-10-CM | POA: Diagnosis not present

## 2019-07-09 DIAGNOSIS — I10 Essential (primary) hypertension: Secondary | ICD-10-CM | POA: Diagnosis not present

## 2019-08-30 DIAGNOSIS — R0981 Nasal congestion: Secondary | ICD-10-CM | POA: Diagnosis not present

## 2019-09-19 DIAGNOSIS — I10 Essential (primary) hypertension: Secondary | ICD-10-CM | POA: Diagnosis not present

## 2019-09-19 DIAGNOSIS — R5383 Other fatigue: Secondary | ICD-10-CM | POA: Diagnosis not present

## 2019-09-19 DIAGNOSIS — R1013 Epigastric pain: Secondary | ICD-10-CM | POA: Diagnosis not present

## 2019-09-19 DIAGNOSIS — R7303 Prediabetes: Secondary | ICD-10-CM | POA: Diagnosis not present

## 2019-09-19 DIAGNOSIS — F411 Generalized anxiety disorder: Secondary | ICD-10-CM | POA: Diagnosis not present

## 2019-09-19 DIAGNOSIS — E7800 Pure hypercholesterolemia, unspecified: Secondary | ICD-10-CM | POA: Diagnosis not present

## 2019-09-23 ENCOUNTER — Other Ambulatory Visit (HOSPITAL_BASED_OUTPATIENT_CLINIC_OR_DEPARTMENT_OTHER): Payer: Self-pay | Admitting: Family Medicine

## 2019-09-23 DIAGNOSIS — R899 Unspecified abnormal finding in specimens from other organs, systems and tissues: Secondary | ICD-10-CM

## 2019-09-23 DIAGNOSIS — R1013 Epigastric pain: Secondary | ICD-10-CM

## 2019-09-23 DIAGNOSIS — R630 Anorexia: Secondary | ICD-10-CM

## 2019-09-24 ENCOUNTER — Ambulatory Visit (HOSPITAL_BASED_OUTPATIENT_CLINIC_OR_DEPARTMENT_OTHER)
Admission: RE | Admit: 2019-09-24 | Discharge: 2019-09-24 | Disposition: A | Discharge status: Home / Self Care | Payer: BC Managed Care – PPO | Source: Ambulatory Visit | Attending: Family Medicine | Admitting: Family Medicine

## 2019-09-24 ENCOUNTER — Other Ambulatory Visit: Payer: Self-pay

## 2019-09-24 DIAGNOSIS — K760 Fatty (change of) liver, not elsewhere classified: Secondary | ICD-10-CM | POA: Diagnosis not present

## 2019-09-24 DIAGNOSIS — R630 Anorexia: Secondary | ICD-10-CM | POA: Insufficient documentation

## 2019-09-24 DIAGNOSIS — R1013 Epigastric pain: Secondary | ICD-10-CM | POA: Insufficient documentation

## 2019-09-24 DIAGNOSIS — R899 Unspecified abnormal finding in specimens from other organs, systems and tissues: Secondary | ICD-10-CM | POA: Diagnosis not present

## 2019-09-24 MED ORDER — IOHEXOL 300 MG/ML  SOLN
100.0000 mL | Freq: Once | INTRAMUSCULAR | Status: AC | PRN
  Administered 2019-09-24: 100 mL via INTRAVENOUS

## 2019-10-07 DIAGNOSIS — K582 Mixed irritable bowel syndrome: Secondary | ICD-10-CM | POA: Diagnosis not present

## 2019-10-07 DIAGNOSIS — R748 Abnormal levels of other serum enzymes: Secondary | ICD-10-CM | POA: Diagnosis not present

## 2019-10-07 DIAGNOSIS — Z86010 Personal history of colonic polyps: Secondary | ICD-10-CM | POA: Diagnosis not present

## 2019-10-07 DIAGNOSIS — R1084 Generalized abdominal pain: Secondary | ICD-10-CM | POA: Diagnosis not present

## 2019-10-07 DIAGNOSIS — R1013 Epigastric pain: Secondary | ICD-10-CM | POA: Diagnosis not present

## 2019-10-18 DIAGNOSIS — N61 Mastitis without abscess: Secondary | ICD-10-CM | POA: Diagnosis not present

## 2019-10-22 DIAGNOSIS — N6310 Unspecified lump in the right breast, unspecified quadrant: Secondary | ICD-10-CM | POA: Diagnosis not present

## 2019-10-24 ENCOUNTER — Other Ambulatory Visit: Payer: Self-pay | Admitting: Family Medicine

## 2019-10-24 DIAGNOSIS — N6310 Unspecified lump in the right breast, unspecified quadrant: Secondary | ICD-10-CM

## 2019-10-25 ENCOUNTER — Ambulatory Visit
Admission: RE | Admit: 2019-10-25 | Discharge: 2019-10-25 | Disposition: A | Discharge status: Home / Self Care | Payer: BC Managed Care – PPO | Source: Ambulatory Visit | Attending: Family Medicine | Admitting: Family Medicine

## 2019-10-25 ENCOUNTER — Other Ambulatory Visit: Payer: Self-pay

## 2019-10-25 DIAGNOSIS — R928 Other abnormal and inconclusive findings on diagnostic imaging of breast: Secondary | ICD-10-CM | POA: Diagnosis not present

## 2019-10-25 DIAGNOSIS — N6310 Unspecified lump in the right breast, unspecified quadrant: Secondary | ICD-10-CM

## 2019-10-25 DIAGNOSIS — N6489 Other specified disorders of breast: Secondary | ICD-10-CM | POA: Diagnosis not present

## 2019-10-31 DIAGNOSIS — E039 Hypothyroidism, unspecified: Secondary | ICD-10-CM | POA: Diagnosis not present

## 2019-10-31 DIAGNOSIS — N951 Menopausal and female climacteric states: Secondary | ICD-10-CM | POA: Diagnosis not present

## 2019-11-04 DIAGNOSIS — E039 Hypothyroidism, unspecified: Secondary | ICD-10-CM | POA: Diagnosis not present

## 2019-11-04 DIAGNOSIS — R232 Flushing: Secondary | ICD-10-CM | POA: Diagnosis not present

## 2019-11-04 DIAGNOSIS — N951 Menopausal and female climacteric states: Secondary | ICD-10-CM | POA: Diagnosis not present

## 2019-11-04 DIAGNOSIS — Z6828 Body mass index (BMI) 28.0-28.9, adult: Secondary | ICD-10-CM | POA: Diagnosis not present

## 2019-11-28 DIAGNOSIS — Z1159 Encounter for screening for other viral diseases: Secondary | ICD-10-CM | POA: Diagnosis not present

## 2019-12-02 DIAGNOSIS — R1084 Generalized abdominal pain: Secondary | ICD-10-CM | POA: Diagnosis not present

## 2019-12-02 DIAGNOSIS — R110 Nausea: Secondary | ICD-10-CM | POA: Diagnosis not present

## 2019-12-02 DIAGNOSIS — K3189 Other diseases of stomach and duodenum: Secondary | ICD-10-CM | POA: Diagnosis not present

## 2019-12-02 DIAGNOSIS — K269 Duodenal ulcer, unspecified as acute or chronic, without hemorrhage or perforation: Secondary | ICD-10-CM | POA: Diagnosis not present

## 2019-12-02 DIAGNOSIS — D123 Benign neoplasm of transverse colon: Secondary | ICD-10-CM | POA: Diagnosis not present

## 2019-12-02 DIAGNOSIS — R1013 Epigastric pain: Secondary | ICD-10-CM | POA: Diagnosis not present

## 2019-12-02 DIAGNOSIS — D120 Benign neoplasm of cecum: Secondary | ICD-10-CM | POA: Diagnosis not present

## 2019-12-02 DIAGNOSIS — K2980 Duodenitis without bleeding: Secondary | ICD-10-CM | POA: Diagnosis not present

## 2019-12-02 DIAGNOSIS — Z86010 Personal history of colonic polyps: Secondary | ICD-10-CM | POA: Diagnosis not present

## 2019-12-02 DIAGNOSIS — K2930 Chronic superficial gastritis without bleeding: Secondary | ICD-10-CM | POA: Diagnosis not present

## 2019-12-02 DIAGNOSIS — K635 Polyp of colon: Secondary | ICD-10-CM | POA: Diagnosis not present

## 2019-12-02 DIAGNOSIS — K621 Rectal polyp: Secondary | ICD-10-CM | POA: Diagnosis not present

## 2020-01-22 DIAGNOSIS — Z20822 Contact with and (suspected) exposure to covid-19: Secondary | ICD-10-CM | POA: Diagnosis not present

## 2020-01-22 DIAGNOSIS — R509 Fever, unspecified: Secondary | ICD-10-CM | POA: Diagnosis not present

## 2020-01-22 DIAGNOSIS — U071 COVID-19: Secondary | ICD-10-CM | POA: Diagnosis not present

## 2020-01-22 DIAGNOSIS — R059 Cough, unspecified: Secondary | ICD-10-CM | POA: Diagnosis not present

## 2020-01-23 ENCOUNTER — Encounter: Payer: Self-pay | Admitting: Infectious Diseases

## 2020-01-23 ENCOUNTER — Ambulatory Visit (HOSPITAL_COMMUNITY)
Admission: RE | Admit: 2020-01-23 | Discharge: 2020-01-23 | Disposition: A | Discharge status: Home / Self Care | Payer: BC Managed Care – PPO | Source: Ambulatory Visit | Attending: Pulmonary Disease | Admitting: Pulmonary Disease

## 2020-01-23 ENCOUNTER — Other Ambulatory Visit: Payer: Self-pay | Admitting: Nurse Practitioner

## 2020-01-23 DIAGNOSIS — I10 Essential (primary) hypertension: Secondary | ICD-10-CM

## 2020-01-23 DIAGNOSIS — E669 Obesity, unspecified: Secondary | ICD-10-CM | POA: Diagnosis not present

## 2020-01-23 DIAGNOSIS — U071 COVID-19: Secondary | ICD-10-CM | POA: Diagnosis not present

## 2020-01-23 DIAGNOSIS — F32A Depression, unspecified: Secondary | ICD-10-CM | POA: Insufficient documentation

## 2020-01-23 MED ORDER — DIPHENHYDRAMINE HCL 50 MG/ML IJ SOLN: 50.0000 mg | Freq: Once | INTRAMUSCULAR | Status: DC | PRN

## 2020-01-23 MED ORDER — SODIUM CHLORIDE 0.9 % IV SOLN
1200.0000 mg | Freq: Once | INTRAVENOUS | Status: AC
  Administered 2020-01-23: 1200 mg via INTRAVENOUS

## 2020-01-23 MED ORDER — METHYLPREDNISOLONE SODIUM SUCC 125 MG IJ SOLR: 125.0000 mg | Freq: Once | INTRAMUSCULAR | Status: DC | PRN

## 2020-01-23 MED ORDER — FAMOTIDINE IN NACL 20-0.9 MG/50ML-% IV SOLN: 20.0000 mg | Freq: Once | INTRAVENOUS | Status: DC | PRN

## 2020-01-23 MED ORDER — ALBUTEROL SULFATE HFA 108 (90 BASE) MCG/ACT IN AERS: 2.0000 | INHALATION_SPRAY | Freq: Once | RESPIRATORY_TRACT | Status: DC | PRN

## 2020-01-23 MED ORDER — SODIUM CHLORIDE 0.9 % IV SOLN: INTRAVENOUS | Status: DC | PRN

## 2020-01-23 MED ORDER — EPINEPHRINE 0.3 MG/0.3ML IJ SOAJ: 0.3000 mg | Freq: Once | INTRAMUSCULAR | Status: DC | PRN

## 2020-01-23 NOTE — Progress Notes (Signed)
  Diagnosis: COVID-19  Physician: Dr. Wright  Procedure: [facility] Med: casirivimab\imdevimab infusion - Provided patient with casirivimab\imdevimab fact sheet for patients, parents and caregivers prior to infusion.  Complications: No immediate complications noted.  Discharge: Discharged home   [name] 01/23/2020   

## 2020-01-23 NOTE — Discharge Instructions (Signed)
10 Things You Can Do to Manage Your COVID-19 Symptoms at Home If you have possible or confirmed COVID-19: 1. Stay home from work and school. And stay away from other public places. If you must go out, avoid using any kind of public transportation, ridesharing, or taxis. 2. Monitor your symptoms carefully. If your symptoms get worse, call your healthcare provider immediately. 3. Get rest and stay hydrated. 4. If you have a medical appointment, call the healthcare provider ahead of time and tell them that you have or may have COVID-19. 5. For medical emergencies, call 911 and notify the dispatch personnel that you have or may have COVID-19. 6. Cover your cough and sneezes with a tissue or use the inside of your elbow. 7. Wash your hands often with soap and water for at least 20 seconds or clean your hands with an alcohol-based hand sanitizer that contains at least 60% alcohol. 8. As much as possible, stay in a specific room and away from other people in your home. Also, you should use a separate bathroom, if available. If you need to be around other people in or outside of the home, wear a mask. 9. Avoid sharing personal items with other people in your household, like dishes, towels, and bedding. 10. Clean all surfaces that are touched often, like counters, tabletops, and doorknobs. Use household cleaning sprays or wipes according to the label instructions. cdc.gov/coronavirus 08/15/2018 This information is not intended to replace advice given to you by your health care provider. Make sure you discuss any questions you have with your health care provider. Document Revised: 01/17/2019 Document Reviewed: 01/17/2019 Elsevier Patient Education  2020 Elsevier Inc. What types of side effects do monoclonal antibody drugs cause?  Common side effects  In general, the more common side effects caused by monoclonal antibody drugs include: . Allergic reactions, such as hives or itching . Flu-like signs and  symptoms, including chills, fatigue, fever, and muscle aches and pains . Nausea, vomiting . Diarrhea . Skin rashes . Low blood pressure   The CDC is recommending patients who receive monoclonal antibody treatments wait at least 90 days before being vaccinated.  Currently, there are no data on the safety and efficacy of mRNA COVID-19 vaccines in persons who received monoclonal antibodies or convalescent plasma as part of COVID-19 treatment. Based on the estimated half-life of such therapies as well as evidence suggesting that reinfection is uncommon in the 90 days after initial infection, vaccination should be deferred for at least 90 days, as a precautionary measure until additional information becomes available, to avoid interference of the antibody treatment with vaccine-induced immune responses. If you have any questions or concerns after the infusion please call the Advanced Practice Provider on call at [phone]. This number is ONLY intended for your use regarding questions or concerns about the infusion post-treatment side-effects.  Please do not provide this number to others for use. For return to work notes please contact your primary care provider.   If someone you know is interested in receiving treatment please have them call the COVID hotline at [phone].   

## 2020-01-23 NOTE — Progress Notes (Signed)
Patient reviewed Fact Sheet for Patients, Parents, and Caregivers for Emergency Use Authorization (EUA) of REGEN-COV2 for the Treatment of Coronavirus. Patient also reviewed and is agreeable to the estimated cost of treatment. Patient is agreeable to proceed.    

## 2020-01-23 NOTE — Progress Notes (Signed)
I connected by phone with [name] on 01/23/2020 at 3:49 PM to discuss the potential use of a new treatment for mild to moderate COVID-19 viral infection in non-hospitalized patients.  This patient is a 59 y.o. female that meets the FDA criteria for Emergency Use Authorization of COVID monoclonal antibody casirivimab/imdevimab, bamlanivimab/eteseviamb, or sotrovimab.  Has a (+) direct SARS-CoV-2 viral test result  Has mild or moderate COVID-19   Is NOT hospitalized due to COVID-19  Is within 10 days of symptom onset  Has at least one of the high risk factor(s) for progression to severe COVID-19 and/or hospitalization as defined in EUA.  Specific high risk criteria : BMI > 25, Cardiovascular disease or hypertension and Other high risk medical condition per CDC:  smoker    I have spoken and communicated the following to the patient or parent/caregiver regarding COVID monoclonal antibody treatment:  1. FDA has authorized the emergency use for the treatment of mild to moderate COVID-19 in adults and pediatric patients with positive results of direct SARS-CoV-2 viral testing who are 52 years of age and older weighing at least 40 kg, and who are at high risk for progressing to severe COVID-19 and/or hospitalization.  2. The significant known and potential risks and benefits of COVID monoclonal antibody, and the extent to which such potential risks and benefits are unknown.  3. Information on available alternative treatments and the risks and benefits of those alternatives, including clinical trials.  4. Patients treated with COVID monoclonal antibody should continue to self-isolate and use infection control measures (e.g., wear mask, isolate, social distance, avoid sharing personal items, clean and disinfect "high touch" surfaces, and frequent handwashing) according to CDC guidelines.   5. The patient or parent/caregiver has the option to accept or refuse COVID monoclonal antibody  treatment.  After reviewing this information with the patient, the patient has agreed to receive one of the available covid 19 monoclonal antibodies and will be provided an appropriate fact sheet prior to infusion. Mayra Reel, NP 01/23/2020 3:49 PM

## 2020-02-25 DIAGNOSIS — N951 Menopausal and female climacteric states: Secondary | ICD-10-CM | POA: Diagnosis not present

## 2020-02-25 DIAGNOSIS — E559 Vitamin D deficiency, unspecified: Secondary | ICD-10-CM | POA: Diagnosis not present

## 2020-02-25 DIAGNOSIS — E039 Hypothyroidism, unspecified: Secondary | ICD-10-CM | POA: Diagnosis not present

## 2020-02-27 DIAGNOSIS — E039 Hypothyroidism, unspecified: Secondary | ICD-10-CM | POA: Diagnosis not present

## 2020-02-27 DIAGNOSIS — N951 Menopausal and female climacteric states: Secondary | ICD-10-CM | POA: Diagnosis not present

## 2020-02-27 DIAGNOSIS — R232 Flushing: Secondary | ICD-10-CM | POA: Diagnosis not present

## 2020-02-27 DIAGNOSIS — E559 Vitamin D deficiency, unspecified: Secondary | ICD-10-CM | POA: Diagnosis not present

## 2020-03-03 DIAGNOSIS — J019 Acute sinusitis, unspecified: Secondary | ICD-10-CM | POA: Diagnosis not present

## 2020-03-11 DIAGNOSIS — R519 Headache, unspecified: Secondary | ICD-10-CM | POA: Diagnosis not present

## 2020-03-11 DIAGNOSIS — I10 Essential (primary) hypertension: Secondary | ICD-10-CM | POA: Diagnosis not present

## 2020-03-11 DIAGNOSIS — E7800 Pure hypercholesterolemia, unspecified: Secondary | ICD-10-CM | POA: Diagnosis not present

## 2020-03-11 DIAGNOSIS — R7303 Prediabetes: Secondary | ICD-10-CM | POA: Diagnosis not present

## 2020-03-18 DIAGNOSIS — G4452 New daily persistent headache (NDPH): Secondary | ICD-10-CM | POA: Diagnosis not present

## 2020-03-18 DIAGNOSIS — R519 Headache, unspecified: Secondary | ICD-10-CM | POA: Diagnosis not present

## 2020-03-19 ENCOUNTER — Other Ambulatory Visit: Payer: Self-pay | Admitting: Family Medicine

## 2020-03-19 DIAGNOSIS — G4452 New daily persistent headache (NDPH): Secondary | ICD-10-CM

## 2020-04-09 ENCOUNTER — Ambulatory Visit
Admission: RE | Admit: 2020-04-09 | Discharge: 2020-04-09 | Disposition: A | Discharge status: Home / Self Care | Payer: BC Managed Care – PPO | Source: Ambulatory Visit | Attending: Family Medicine | Admitting: Family Medicine

## 2020-04-09 ENCOUNTER — Other Ambulatory Visit: Payer: Self-pay

## 2020-04-09 DIAGNOSIS — G4452 New daily persistent headache (NDPH): Secondary | ICD-10-CM | POA: Diagnosis not present

## 2020-04-09 DIAGNOSIS — J0120 Acute ethmoidal sinusitis, unspecified: Secondary | ICD-10-CM | POA: Diagnosis not present

## 2020-04-09 DIAGNOSIS — J0110 Acute frontal sinusitis, unspecified: Secondary | ICD-10-CM | POA: Diagnosis not present

## 2020-04-09 DIAGNOSIS — J322 Chronic ethmoidal sinusitis: Secondary | ICD-10-CM | POA: Diagnosis not present

## 2020-04-09 MED ORDER — GADOBENATE DIMEGLUMINE 529 MG/ML IV SOLN
20.0000 mL | Freq: Once | INTRAVENOUS | Status: AC | PRN
  Administered 2020-04-09: 20 mL via INTRAVENOUS

## 2020-04-09 MED ORDER — GADOBENATE DIMEGLUMINE 529 MG/ML IV SOLN: 20.0000 mL | Freq: Once | INTRAVENOUS | Status: DC | PRN

## 2020-05-08 DIAGNOSIS — R350 Frequency of micturition: Secondary | ICD-10-CM | POA: Diagnosis not present

## 2020-06-29 DIAGNOSIS — E039 Hypothyroidism, unspecified: Secondary | ICD-10-CM | POA: Diagnosis not present

## 2020-06-29 DIAGNOSIS — N951 Menopausal and female climacteric states: Secondary | ICD-10-CM | POA: Diagnosis not present

## 2020-07-01 DIAGNOSIS — E039 Hypothyroidism, unspecified: Secondary | ICD-10-CM | POA: Diagnosis not present

## 2020-07-01 DIAGNOSIS — R6882 Decreased libido: Secondary | ICD-10-CM | POA: Diagnosis not present

## 2020-07-01 DIAGNOSIS — N951 Menopausal and female climacteric states: Secondary | ICD-10-CM | POA: Diagnosis not present

## 2020-07-01 DIAGNOSIS — N898 Other specified noninflammatory disorders of vagina: Secondary | ICD-10-CM | POA: Diagnosis not present

## 2020-07-08 DIAGNOSIS — E7800 Pure hypercholesterolemia, unspecified: Secondary | ICD-10-CM | POA: Diagnosis not present

## 2020-07-08 DIAGNOSIS — R7303 Prediabetes: Secondary | ICD-10-CM | POA: Diagnosis not present

## 2020-09-27 DIAGNOSIS — R102 Pelvic and perineal pain: Secondary | ICD-10-CM | POA: Diagnosis not present

## 2020-09-27 DIAGNOSIS — R319 Hematuria, unspecified: Secondary | ICD-10-CM | POA: Diagnosis not present

## 2020-09-28 DIAGNOSIS — R310 Gross hematuria: Secondary | ICD-10-CM | POA: Diagnosis not present

## 2020-09-30 DIAGNOSIS — K760 Fatty (change of) liver, not elsewhere classified: Secondary | ICD-10-CM | POA: Diagnosis not present

## 2020-09-30 DIAGNOSIS — I700 Atherosclerosis of aorta: Secondary | ICD-10-CM | POA: Diagnosis not present

## 2020-09-30 DIAGNOSIS — R310 Gross hematuria: Secondary | ICD-10-CM | POA: Diagnosis not present

## 2020-09-30 DIAGNOSIS — Z9049 Acquired absence of other specified parts of digestive tract: Secondary | ICD-10-CM | POA: Diagnosis not present

## 2020-10-09 DIAGNOSIS — N951 Menopausal and female climacteric states: Secondary | ICD-10-CM | POA: Diagnosis not present

## 2020-10-09 DIAGNOSIS — E039 Hypothyroidism, unspecified: Secondary | ICD-10-CM | POA: Diagnosis not present

## 2020-10-14 DIAGNOSIS — Z6829 Body mass index (BMI) 29.0-29.9, adult: Secondary | ICD-10-CM | POA: Diagnosis not present

## 2020-10-14 DIAGNOSIS — E039 Hypothyroidism, unspecified: Secondary | ICD-10-CM | POA: Diagnosis not present

## 2020-10-14 DIAGNOSIS — N951 Menopausal and female climacteric states: Secondary | ICD-10-CM | POA: Diagnosis not present

## 2020-10-14 DIAGNOSIS — N898 Other specified noninflammatory disorders of vagina: Secondary | ICD-10-CM | POA: Diagnosis not present

## 2020-11-10 DIAGNOSIS — R3982 Chronic bladder pain: Secondary | ICD-10-CM | POA: Diagnosis not present

## 2020-11-10 DIAGNOSIS — M545 Low back pain, unspecified: Secondary | ICD-10-CM | POA: Diagnosis not present

## 2020-11-10 DIAGNOSIS — R350 Frequency of micturition: Secondary | ICD-10-CM | POA: Diagnosis not present

## 2020-11-10 DIAGNOSIS — R8271 Bacteriuria: Secondary | ICD-10-CM | POA: Diagnosis not present

## 2020-11-12 DIAGNOSIS — E7800 Pure hypercholesterolemia, unspecified: Secondary | ICD-10-CM | POA: Diagnosis not present

## 2020-11-12 DIAGNOSIS — R7303 Prediabetes: Secondary | ICD-10-CM | POA: Diagnosis not present

## 2020-12-08 ENCOUNTER — Other Ambulatory Visit: Payer: Self-pay | Admitting: Family Medicine

## 2020-12-08 DIAGNOSIS — Z1231 Encounter for screening mammogram for malignant neoplasm of breast: Secondary | ICD-10-CM

## 2020-12-15 ENCOUNTER — Ambulatory Visit
Admission: RE | Admit: 2020-12-15 | Discharge: 2020-12-15 | Disposition: A | Discharge status: Home / Self Care | Payer: BC Managed Care – PPO | Source: Ambulatory Visit | Attending: Family Medicine | Admitting: Family Medicine

## 2020-12-15 DIAGNOSIS — Z1231 Encounter for screening mammogram for malignant neoplasm of breast: Secondary | ICD-10-CM

## 2021-01-26 DIAGNOSIS — E039 Hypothyroidism, unspecified: Secondary | ICD-10-CM | POA: Diagnosis not present

## 2021-01-26 DIAGNOSIS — N951 Menopausal and female climacteric states: Secondary | ICD-10-CM | POA: Diagnosis not present

## 2021-01-27 DIAGNOSIS — R6882 Decreased libido: Secondary | ICD-10-CM | POA: Diagnosis not present

## 2021-01-27 DIAGNOSIS — N898 Other specified noninflammatory disorders of vagina: Secondary | ICD-10-CM | POA: Diagnosis not present

## 2021-01-27 DIAGNOSIS — Z6828 Body mass index (BMI) 28.0-28.9, adult: Secondary | ICD-10-CM | POA: Diagnosis not present

## 2021-01-27 DIAGNOSIS — N951 Menopausal and female climacteric states: Secondary | ICD-10-CM | POA: Diagnosis not present

## 2021-03-16 DIAGNOSIS — F411 Generalized anxiety disorder: Secondary | ICD-10-CM | POA: Diagnosis not present

## 2021-03-16 DIAGNOSIS — E7800 Pure hypercholesterolemia, unspecified: Secondary | ICD-10-CM | POA: Diagnosis not present

## 2021-03-16 DIAGNOSIS — R7303 Prediabetes: Secondary | ICD-10-CM | POA: Diagnosis not present

## 2021-03-16 DIAGNOSIS — G44201 Tension-type headache, unspecified, intractable: Secondary | ICD-10-CM | POA: Diagnosis not present

## 2021-03-23 DIAGNOSIS — M25561 Pain in right knee: Secondary | ICD-10-CM | POA: Diagnosis not present

## 2021-03-23 DIAGNOSIS — M1711 Unilateral primary osteoarthritis, right knee: Secondary | ICD-10-CM | POA: Diagnosis not present

## 2021-05-06 IMAGING — MR MR HEAD WO/W CM
14 series · 48 of 48 positions shown · IV contrast (20ml Multihance)
Comparison: MRI of the brain May 14, 2013.

CLINICAL DATA: New daily persistent headache.

EXAM:
MRI HEAD WITHOUT AND WITH CONTRAST
TECHNIQUE: Multiplanar, multiecho pulse sequences of the brain and surrounding
structures were obtained without and with intravenous contrast.
CONTRAST:  20mL MULTIHANCE GADOBENATE DIMEGLUMINE 529 MG/ML IV SOLN

[Series 2: T1 · sagittal · 5.0mm · 0.45mm/px · 2 of 21 slices shown]
[im 1/21]
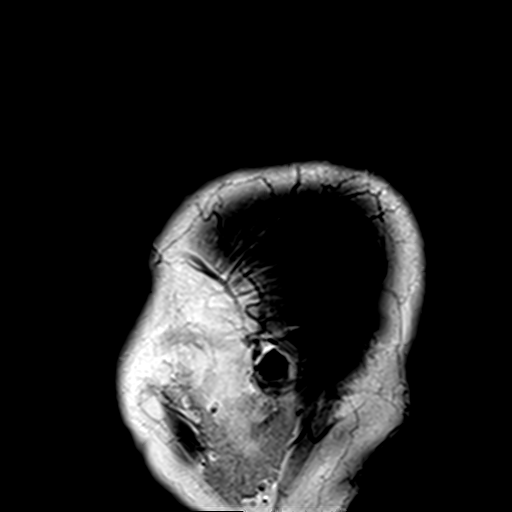
[im 21/21]
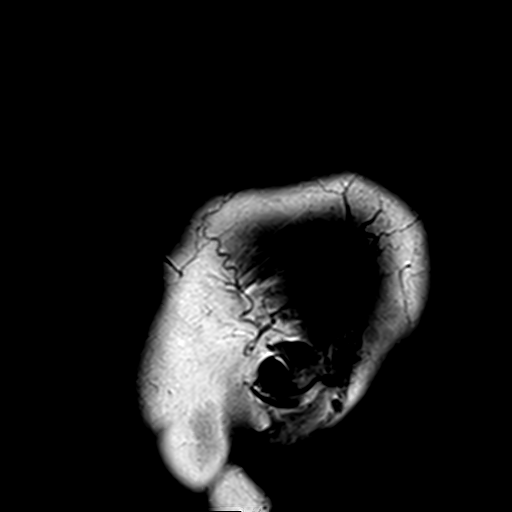

[Series 3: DWI · axial · 3.0mm · 1.80mm/px · z∈[-76,+70]mm · 6 of 100 slices shown (1 of 4)]
[im 1/100]
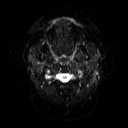
[im 20/100]
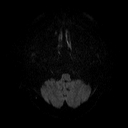
[im 40/100]
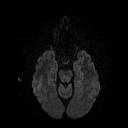
[im 60/100]
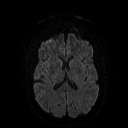
[im 80/100]
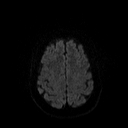
[im 100/100]
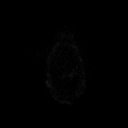

[Series 4: DWI · axial · 3.0mm · 1.80mm/px · z∈[-76,+70]mm · 3 of 49 slices shown (2 of 4)]
[im 1/49]
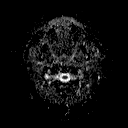
[im 25/49]
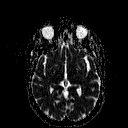
[im 49/49]
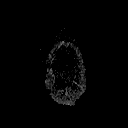

[Series 5: DWI · coronal · 5.0mm · 1.80mm/px · 4 of 68 slices shown (3 of 4)]
[im 1/68]
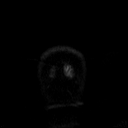
[im 23/68]
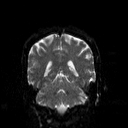
[im 45/68]
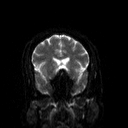
[im 68/68]
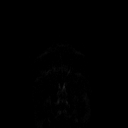

[Series 6: DWI · coronal · 5.0mm · 1.80mm/px · 2 of 34 slices shown (4 of 4)]
[im 1/34]
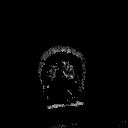
[im 34/34]
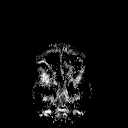

[Series 7: T2 · axial · 5.0mm · 0.60mm/px · 1 of 22 slices shown (1 of 2)]
[im 1/22]
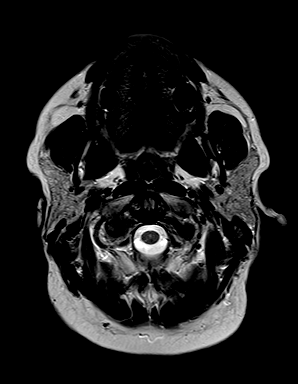

[Series 8: FLAIR · axial · 3.0mm · 0.45mm/px · z∈[-70,+64]mm · 2 of 30 slices shown (1 of 2)]
[im 1/30]
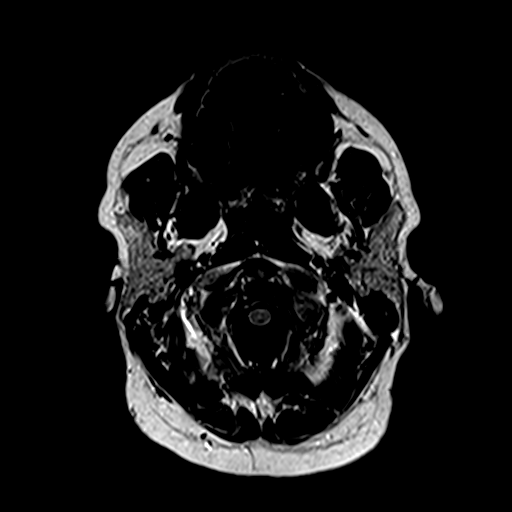
[im 30/30]
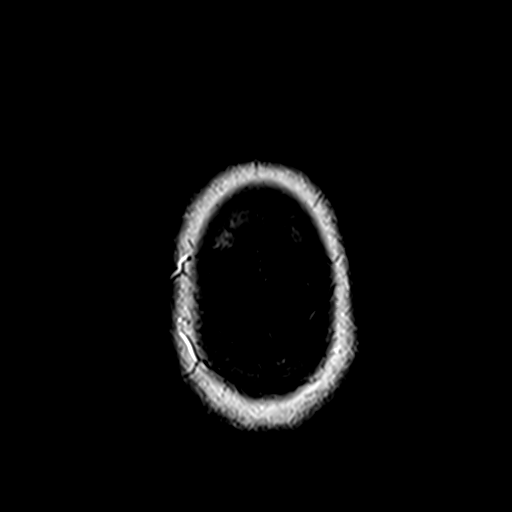

[Series 9: mip_images(sw) · axial · 32.0mm · 0.90mm/px · z∈[-58,+53]mm · 2 of 29 slices shown]
[im 1/29]
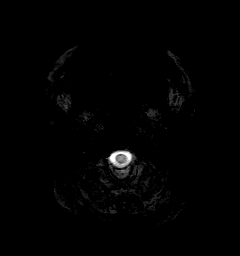
[im 29/29]
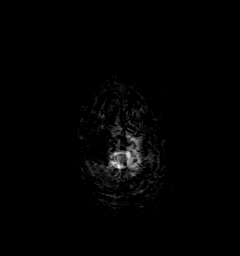

[Series 10: swi_images · axial · 4.0mm · 0.90mm/px · z∈[-72,+67]mm · 2 of 36 slices shown]
[im 1/36]
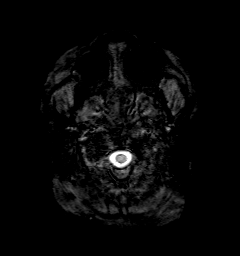
[im 36/36]
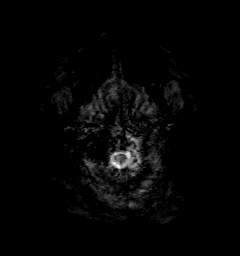

[Series 11: t1_mpr_tra · axial · 1.0mm · 0.75mm/px · z∈[-73,+70]mm · 9 of 144 slices shown]
[im 1/144]
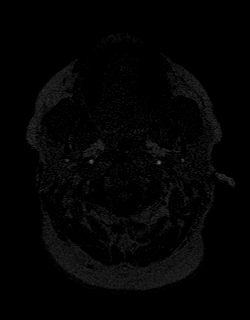
[im 18/144]
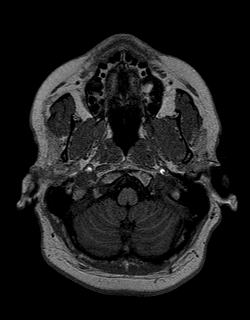
[im 36/144]
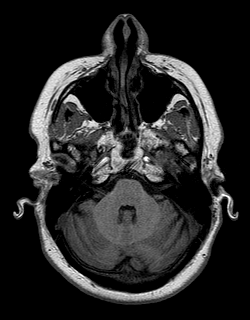
[im 54/144]
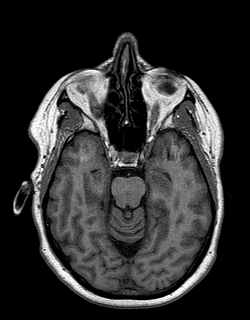
[im 72/144]
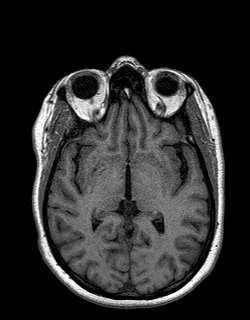
[im 90/144]
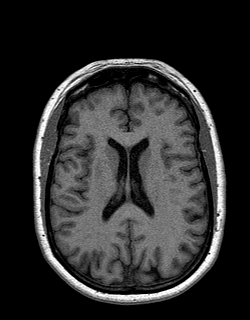
[im 108/144]
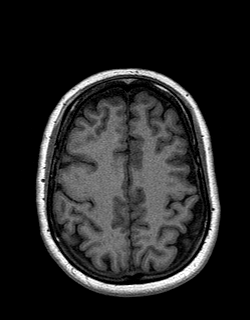
[im 126/144]
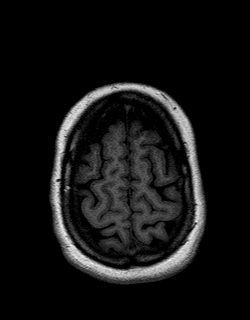
[im 144/144]
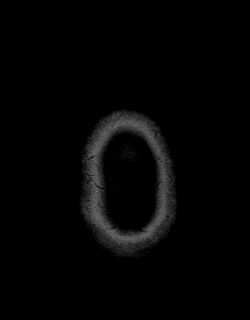

[Series 12: FLAIR · sagittal · 5.0mm · 0.45mm/px · 2 of 25 slices shown (2 of 2)]
[im 1/25]
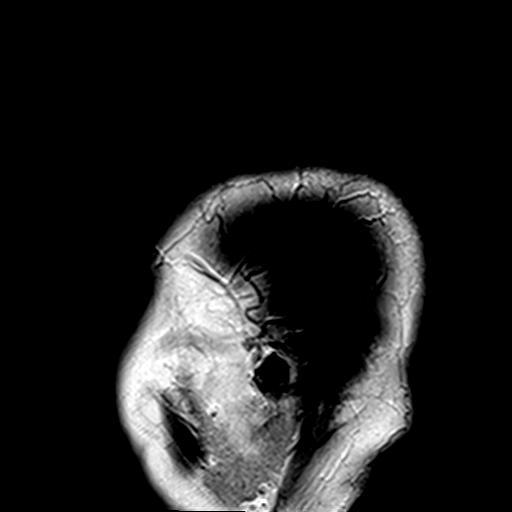
[im 25/25]
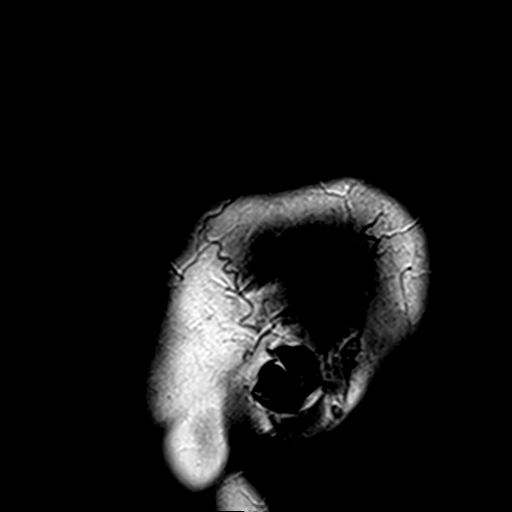

[Series 13: T2 · coronal · 5.0mm · 0.45mm/px · 2 of 25 slices shown (2 of 2)]
[im 1/25]
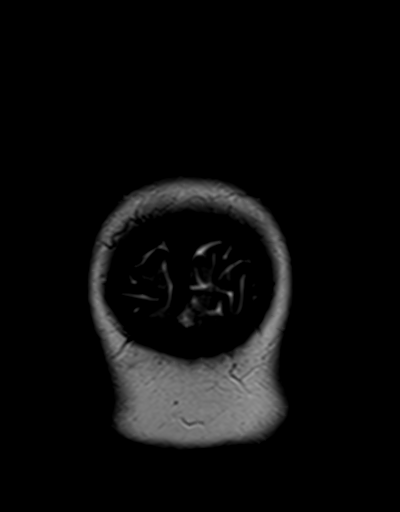
[im 25/25]
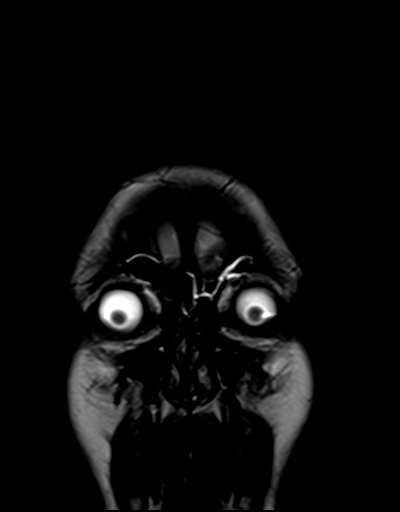

[Series 14: t1_mpr_tra post · axial · 1.0mm · 0.75mm/px · z∈[-73,+70]mm · 9 of 144 slices shown]
[im 1/144]
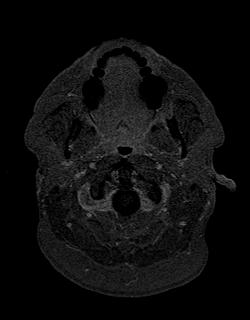
[im 18/144]
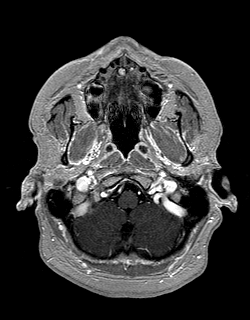
[im 36/144]
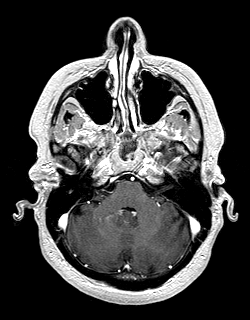
[im 54/144]
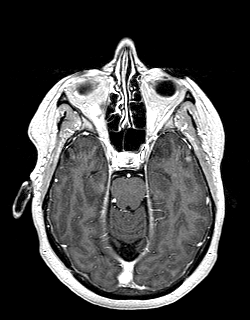
[im 72/144]
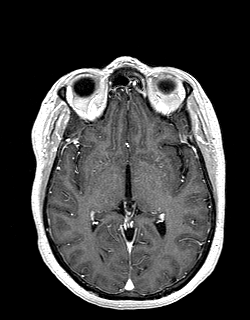
[im 90/144]
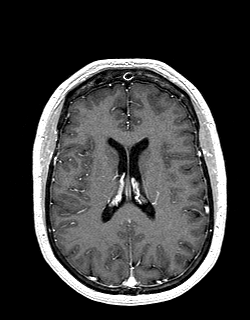
[im 108/144]
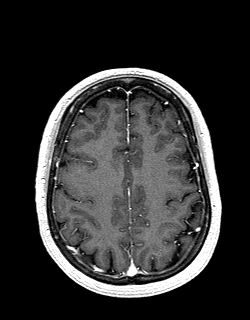
[im 126/144]
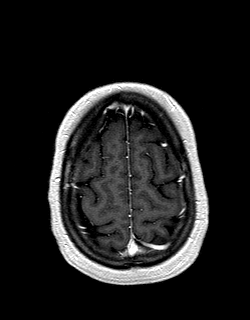
[im 144/144]
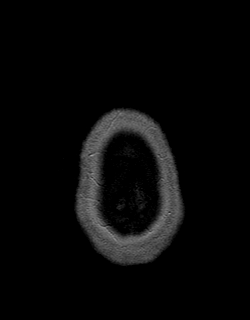

[Series 15: post cor · coronal · 5.0mm · 0.45mm/px · 2 of 25 slices shown]
[im 1/25]
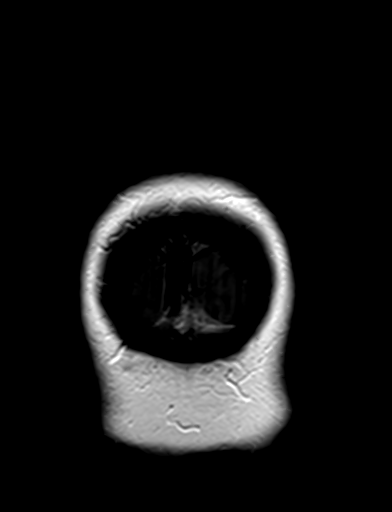
[im 25/25]
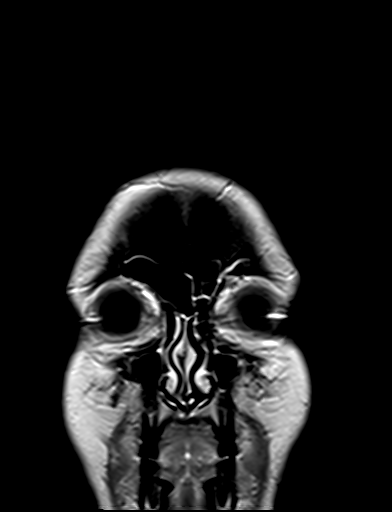

[48 of 48 positions shown; findings below may reference images not displayed]

FINDINGS: Brain: No acute infarction, hemorrhage, hydrocephalus, extra-axial
collection or mass lesion. A few punctate foci of T2 hyperintensity
are seen within the white matter of the cerebral hemispheres,
nonspecific. No focus of abnormal contrast enhancement.

Vascular: Normal flow voids.

Skull and upper cervical spine: Normal marrow signal.

Sinuses/Orbits: Mucosal thickening of the bilateral ethmoid cells
and frontal sinuses. The orbits are maintained.
IMPRESSION: 1. No acute intracranial abnormality or abnormal contrast
enhancement.
2. A few punctate foci of T2 hyperintensity within the white matter
of the cerebral hemispheres are nonspecific and may be seen with
migraine headaches or chronic microvascular ischemic disease.
3. Mild inflammatory changes of the bilateral ethmoid and frontal
sinuses.

## 2021-05-17 DIAGNOSIS — J019 Acute sinusitis, unspecified: Secondary | ICD-10-CM | POA: Diagnosis not present

## 2021-05-20 DIAGNOSIS — N951 Menopausal and female climacteric states: Secondary | ICD-10-CM | POA: Diagnosis not present

## 2021-05-20 DIAGNOSIS — E039 Hypothyroidism, unspecified: Secondary | ICD-10-CM | POA: Diagnosis not present

## 2021-05-24 DIAGNOSIS — R232 Flushing: Secondary | ICD-10-CM | POA: Diagnosis not present

## 2021-05-24 DIAGNOSIS — Z6828 Body mass index (BMI) 28.0-28.9, adult: Secondary | ICD-10-CM | POA: Diagnosis not present

## 2021-05-24 DIAGNOSIS — N951 Menopausal and female climacteric states: Secondary | ICD-10-CM | POA: Diagnosis not present

## 2021-06-08 DIAGNOSIS — H35033 Hypertensive retinopathy, bilateral: Secondary | ICD-10-CM | POA: Diagnosis not present

## 2021-06-21 DIAGNOSIS — E7800 Pure hypercholesterolemia, unspecified: Secondary | ICD-10-CM | POA: Diagnosis not present

## 2021-06-21 DIAGNOSIS — R7303 Prediabetes: Secondary | ICD-10-CM | POA: Diagnosis not present

## 2021-06-21 DIAGNOSIS — R1310 Dysphagia, unspecified: Secondary | ICD-10-CM | POA: Diagnosis not present

## 2021-06-21 DIAGNOSIS — I10 Essential (primary) hypertension: Secondary | ICD-10-CM | POA: Diagnosis not present

## 2021-06-23 ENCOUNTER — Other Ambulatory Visit: Payer: Self-pay | Admitting: Family Medicine

## 2021-06-23 DIAGNOSIS — R1310 Dysphagia, unspecified: Secondary | ICD-10-CM

## 2021-06-28 ENCOUNTER — Ambulatory Visit
Admission: RE | Admit: 2021-06-28 | Discharge: 2021-06-28 | Disposition: A | Discharge status: Home / Self Care | Payer: BC Managed Care – PPO | Source: Ambulatory Visit | Attending: Family Medicine | Admitting: Family Medicine

## 2021-06-28 DIAGNOSIS — R1310 Dysphagia, unspecified: Secondary | ICD-10-CM

## 2021-06-28 DIAGNOSIS — E041 Nontoxic single thyroid nodule: Secondary | ICD-10-CM | POA: Diagnosis not present

## 2021-07-01 DIAGNOSIS — R946 Abnormal results of thyroid function studies: Secondary | ICD-10-CM | POA: Diagnosis not present

## 2021-12-15 ENCOUNTER — Other Ambulatory Visit: Payer: Self-pay | Admitting: Family Medicine

## 2021-12-15 DIAGNOSIS — Z1231 Encounter for screening mammogram for malignant neoplasm of breast: Secondary | ICD-10-CM

## 2021-12-21 ENCOUNTER — Ambulatory Visit
Admission: RE | Admit: 2021-12-21 | Discharge: 2021-12-21 | Disposition: A | Discharge status: Home / Self Care | Payer: No Typology Code available for payment source | Source: Ambulatory Visit | Attending: Family Medicine | Admitting: Family Medicine

## 2021-12-21 DIAGNOSIS — Z1231 Encounter for screening mammogram for malignant neoplasm of breast: Secondary | ICD-10-CM

## 2022-12-14 ENCOUNTER — Encounter: Payer: Self-pay | Admitting: Family Medicine

## 2022-12-14 DIAGNOSIS — Z1231 Encounter for screening mammogram for malignant neoplasm of breast: Secondary | ICD-10-CM

## 2022-12-15 ENCOUNTER — Other Ambulatory Visit: Payer: Self-pay | Admitting: Pain Medicine

## 2022-12-15 DIAGNOSIS — N6452 Nipple discharge: Secondary | ICD-10-CM

## 2022-12-29 ENCOUNTER — Ambulatory Visit
Admission: RE | Admit: 2022-12-29 | Discharge: 2022-12-29 | Disposition: A | Discharge status: Home / Self Care | Payer: No Typology Code available for payment source | Source: Ambulatory Visit | Attending: Pain Medicine | Admitting: Pain Medicine

## 2022-12-29 DIAGNOSIS — N6452 Nipple discharge: Secondary | ICD-10-CM

## 2023-01-16 ENCOUNTER — Other Ambulatory Visit: Payer: Self-pay | Admitting: Family Medicine

## 2023-01-16 DIAGNOSIS — N6452 Nipple discharge: Secondary | ICD-10-CM

## 2023-02-25 ENCOUNTER — Other Ambulatory Visit: Payer: No Typology Code available for payment source

## 2023-04-01 ENCOUNTER — Ambulatory Visit
Admission: RE | Admit: 2023-04-01 | Discharge: 2023-04-01 | Disposition: A | Discharge status: Home / Self Care | Payer: No Typology Code available for payment source | Source: Ambulatory Visit | Attending: Family Medicine | Admitting: Family Medicine

## 2023-04-01 DIAGNOSIS — N6452 Nipple discharge: Secondary | ICD-10-CM

## 2023-04-01 MED ORDER — GADOPICLENOL 0.5 MMOL/ML IV SOLN
9.0000 mL | Freq: Once | INTRAVENOUS | Status: AC | PRN
  Administered 2023-04-01: 9 mL via INTRAVENOUS
